# Patient Record
Sex: Male | Born: 1950 | Hispanic: No | Marital: Married | Smoking: Never smoker
Health system: Southern US, Community
[De-identification: ages and names within clinical notes are randomized; demographics above are authoritative.]

## PROBLEM LIST (undated history)

## (undated) DIAGNOSIS — R519 Headache, unspecified: Secondary | ICD-10-CM

## (undated) DIAGNOSIS — F419 Anxiety disorder, unspecified: Secondary | ICD-10-CM

## (undated) DIAGNOSIS — M545 Low back pain, unspecified: Secondary | ICD-10-CM

## (undated) DIAGNOSIS — I499 Cardiac arrhythmia, unspecified: Secondary | ICD-10-CM

## (undated) DIAGNOSIS — G4730 Sleep apnea, unspecified: Secondary | ICD-10-CM

## (undated) DIAGNOSIS — R7303 Prediabetes: Secondary | ICD-10-CM

## (undated) DIAGNOSIS — Z87442 Personal history of urinary calculi: Secondary | ICD-10-CM

## (undated) DIAGNOSIS — H9190 Unspecified hearing loss, unspecified ear: Secondary | ICD-10-CM

## (undated) DIAGNOSIS — E785 Hyperlipidemia, unspecified: Secondary | ICD-10-CM

## (undated) DIAGNOSIS — R918 Other nonspecific abnormal finding of lung field: Secondary | ICD-10-CM

## (undated) HISTORY — PX: APPENDECTOMY: SHX54

## (undated) HISTORY — PX: OTHER SURGICAL HISTORY: SHX169

---
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
## 1976-07-22 DIAGNOSIS — I4510 Unspecified right bundle-branch block: Secondary | ICD-10-CM

## 1976-07-22 HISTORY — DX: Unspecified right bundle-branch block: I45.10

## 1984-07-22 HISTORY — PX: COLONOSCOPY: SHX174

## 1993-07-22 HISTORY — PX: ARTHROSCOPIC REPAIR ACL: SUR80

## 2016-05-17 ENCOUNTER — Ambulatory Visit: Admit: 2016-05-17 | Payer: Self-pay | Admitting: Unknown Physician Specialty

## 2016-05-17 SURGERY — COLONOSCOPY WITH PROPOFOL
Anesthesia: General

## 2016-07-19 ENCOUNTER — Ambulatory Visit: Admission: RE | Admit: 2016-07-19 | Payer: Self-pay | Source: Ambulatory Visit | Admitting: Unknown Physician Specialty

## 2016-07-19 ENCOUNTER — Encounter: Admission: RE | Payer: Self-pay | Source: Ambulatory Visit

## 2016-07-19 SURGERY — COLONOSCOPY WITH PROPOFOL
Anesthesia: General

## 2016-10-31 HISTORY — PX: COLONOSCOPY: SHX174

## 2018-04-15 ENCOUNTER — Other Ambulatory Visit: Payer: Self-pay | Admitting: Family Medicine

## 2018-04-15 ENCOUNTER — Ambulatory Visit
Admission: RE | Admit: 2018-04-15 | Discharge: 2018-04-15 | Disposition: A | Discharge status: Home / Self Care | Payer: Federal, State, Local not specified - PPO | Source: Ambulatory Visit | Attending: Family Medicine | Admitting: Family Medicine

## 2018-04-15 DIAGNOSIS — K5732 Diverticulitis of large intestine without perforation or abscess without bleeding: Secondary | ICD-10-CM | POA: Diagnosis not present

## 2018-04-15 DIAGNOSIS — R1032 Left lower quadrant pain: Secondary | ICD-10-CM

## 2018-04-15 MED ORDER — IOPAMIDOL (ISOVUE-300) INJECTION 61%
100.0000 mL | Freq: Once | INTRAVENOUS | Status: AC | PRN
  Administered 2018-04-15: 100 mL via INTRAVENOUS
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
## 2021-03-06 ENCOUNTER — Other Ambulatory Visit: Payer: Self-pay | Admitting: Student

## 2021-03-06 DIAGNOSIS — M7521 Bicipital tendinitis, right shoulder: Secondary | ICD-10-CM

## 2021-03-06 DIAGNOSIS — M7581 Other shoulder lesions, right shoulder: Secondary | ICD-10-CM

## 2021-03-10 ENCOUNTER — Ambulatory Visit
Admission: RE | Admit: 2021-03-10 | Discharge: 2021-03-10 | Disposition: A | Discharge status: Home / Self Care | Payer: Medicare Other | Source: Ambulatory Visit | Attending: Student | Admitting: Student

## 2021-03-10 DIAGNOSIS — M7581 Other shoulder lesions, right shoulder: Secondary | ICD-10-CM

## 2021-03-10 DIAGNOSIS — M7521 Bicipital tendinitis, right shoulder: Secondary | ICD-10-CM

## 2021-03-11 ENCOUNTER — Ambulatory Visit: Payer: Federal, State, Local not specified - PPO

## 2021-03-12 ENCOUNTER — Other Ambulatory Visit: Payer: Self-pay | Admitting: Student

## 2021-03-12 DIAGNOSIS — M7581 Other shoulder lesions, right shoulder: Secondary | ICD-10-CM

## 2021-03-12 DIAGNOSIS — M7521 Bicipital tendinitis, right shoulder: Secondary | ICD-10-CM

## 2021-04-03 ENCOUNTER — Other Ambulatory Visit (HOSPITAL_COMMUNITY): Payer: Self-pay | Admitting: Student

## 2021-04-03 DIAGNOSIS — M7581 Other shoulder lesions, right shoulder: Secondary | ICD-10-CM

## 2021-04-03 DIAGNOSIS — M7521 Bicipital tendinitis, right shoulder: Secondary | ICD-10-CM

## 2021-04-17 NOTE — Progress Notes (Addendum)
I checked Gabriel Crawford chart for H/P, the last one I found was on 03/06/21 at [facility].  I called the office, [phone], I spoke to [place], she took the information and is forwarding to [place], [place].

## 2021-04-18 ENCOUNTER — Encounter (HOSPITAL_COMMUNITY): Payer: Self-pay | Admitting: *Deleted

## 2021-04-18 NOTE — Progress Notes (Signed)
I spoke with [place] at [facility] at [phone] requesting H&P from [place], [place] for surgery (04/19/21) on this patient. Patient is being seen this morning and PA will put in H&P after the appointment.

## 2021-04-18 NOTE — Anesthesia Preprocedure Evaluation (Addendum)
Anesthesia Evaluation  Patient identified by MRN, date of birth, ID band Patient awake    Reviewed: Allergy & Precautions, NPO status , Patient's Chart, lab work & pertinent test results  Airway Mallampati: III  TM Distance: >3 FB Neck ROM: Full    Dental  (+) Teeth Intact   Pulmonary sleep apnea and Continuous Positive Airway Pressure Ventilation ,    Pulmonary exam normal        Cardiovascular negative cardio ROS   Rhythm:Regular Rate:Normal     Neuro/Psych  Headaches, Anxiety    GI/Hepatic negative GI ROS, Neg liver ROS,   Endo/Other  negative endocrine ROS  Renal/GU negative Renal ROS  negative genitourinary   Musculoskeletal  (+) Arthritis , Osteoarthritis,    Abdominal (+) + obese,   Peds  Hematology negative hematology ROS (+)   Anesthesia Other Findings   Reproductive/Obstetrics                            Anesthesia Physical Anesthesia Plan  ASA: 3  Anesthesia Plan: MAC   Post-op Pain Management:    Induction: Intravenous  PONV Risk Score and Plan: 2 and Ondansetron, Dexamethasone, Midazolam and Treatment may vary due to age or medical condition  Airway Management Planned: Simple Face Mask  Additional Equipment: None  Intra-op Plan:   Post-operative Plan:   Informed Consent: I have reviewed the patients History and Physical, chart, labs and discussed the procedure including the risks, benefits and alternatives for the proposed anesthesia with the patient or authorized representative who has indicated his/her understanding and acceptance.     Dental advisory given  Plan Discussed with: CRNA  Anesthesia Plan Comments: (Plan for mac with advancement to GA if needed.)       Anesthesia Quick Evaluation

## 2021-04-18 NOTE — Progress Notes (Signed)
DUE TO COVID-19 ONLY ONE VISITOR IS ALLOWED TO COME WITH YOU AND STAY IN THE WAITING ROOM ONLY DURING PRE OP AND PROCEDURE DAY OF SURGERY.   PCP - Dr Kandyce Rud Cardiologist - n/a  Chest x-ray - n/a EKG - n/a Stress Test - n/a ECHO - n/a Cardiac Cath - n/a  ICD Pacemaker/Loop - n/a  Sleep Study -  Yes CPAP - uses cpap  Anesthesia review: Yes  STOP now taking any Aspirin (unless otherwise instructed by your surgeon), Aleve, Naproxen, Ibuprofen, Motrin, Advil, Goody's, BC's, all herbal medications, fish oil, and all vitamins.   Coronavirus Screening Covid test is n/a Ambulatory Surgery  Do you have any of the following symptoms:  Cough yes/no: No Fever (>100.24F)  yes/no: No Runny nose yes/no: No Sore throat yes/no: No Difficulty breathing/shortness of breath  yes/no: No  Have you traveled in the last 14 days and where? yes/no: No  Patient verbalized understanding of instructions that were given via phone.

## 2021-04-19 ENCOUNTER — Encounter (HOSPITAL_COMMUNITY): Payer: Self-pay

## 2021-04-19 ENCOUNTER — Ambulatory Visit (HOSPITAL_COMMUNITY): Admission: RE | Admit: 2021-04-19 | Payer: Medicare Other | Source: Home / Self Care

## 2021-04-19 ENCOUNTER — Other Ambulatory Visit: Payer: Self-pay

## 2021-04-19 ENCOUNTER — Encounter (HOSPITAL_COMMUNITY): Admission: RE | Disposition: A | Discharge status: Home / Self Care | Payer: Self-pay | Source: Home / Self Care

## 2021-04-19 ENCOUNTER — Ambulatory Visit (HOSPITAL_COMMUNITY): Payer: Medicare Other | Admitting: Anesthesiology

## 2021-04-19 ENCOUNTER — Ambulatory Visit (HOSPITAL_COMMUNITY)
Admission: RE | Admit: 2021-04-19 | Discharge: 2021-04-19 | Disposition: A | Discharge status: Home / Self Care | Payer: Medicare Other | Attending: Student | Admitting: Student

## 2021-04-19 ENCOUNTER — Ambulatory Visit (HOSPITAL_COMMUNITY)
Admission: RE | Admit: 2021-04-19 | Discharge: 2021-04-19 | Disposition: A | Discharge status: Home / Self Care | Payer: Medicare Other | Source: Ambulatory Visit | Attending: Student | Admitting: Student

## 2021-04-19 ENCOUNTER — Encounter (HOSPITAL_COMMUNITY): Admission: RE | Payer: Self-pay | Source: Home / Self Care

## 2021-04-19 DIAGNOSIS — Y929 Unspecified place or not applicable: Secondary | ICD-10-CM | POA: Insufficient documentation

## 2021-04-19 DIAGNOSIS — M7521 Bicipital tendinitis, right shoulder: Secondary | ICD-10-CM | POA: Insufficient documentation

## 2021-04-19 DIAGNOSIS — M7581 Other shoulder lesions, right shoulder: Secondary | ICD-10-CM | POA: Insufficient documentation

## 2021-04-19 DIAGNOSIS — Y939 Activity, unspecified: Secondary | ICD-10-CM | POA: Insufficient documentation

## 2021-04-19 HISTORY — DX: Sleep apnea, unspecified: G47.30

## 2021-04-19 HISTORY — DX: Other nonspecific abnormal finding of lung field: R91.8

## 2021-04-19 HISTORY — DX: Anxiety disorder, unspecified: F41.9

## 2021-04-19 HISTORY — DX: Low back pain, unspecified: M54.50

## 2021-04-19 HISTORY — DX: Unspecified hearing loss, unspecified ear: H91.90

## 2021-04-19 HISTORY — DX: Personal history of urinary calculi: Z87.442

## 2021-04-19 HISTORY — DX: Headache, unspecified: R51.9

## 2021-04-19 HISTORY — DX: Hyperlipidemia, unspecified: E78.5

## 2021-04-19 HISTORY — PX: RADIOLOGY WITH ANESTHESIA: SHX6223

## 2021-04-19 HISTORY — DX: Prediabetes: R73.03

## 2021-04-19 HISTORY — DX: Cardiac arrhythmia, unspecified: I49.9

## 2021-04-19 SURGERY — MRI WITH ANESTHESIA
Anesthesia: Monitor Anesthesia Care | Laterality: Right

## 2021-04-19 SURGERY — MRI WITH ANESTHESIA
Anesthesia: General | Laterality: Right

## 2021-04-19 MED ORDER — ORAL CARE MOUTH RINSE: 15.0000 mL | Freq: Once | OROMUCOSAL | Status: DC

## 2021-04-19 MED ORDER — KETOROLAC TROMETHAMINE 30 MG/ML IJ SOLN: 30.0000 mg | Freq: Once | INTRAMUSCULAR | Status: DC | PRN

## 2021-04-19 MED ORDER — DEXMEDETOMIDINE (PRECEDEX) IN NS 20 MCG/5ML (4 MCG/ML) IV SYRINGE
PREFILLED_SYRINGE | INTRAVENOUS | Status: DC | PRN
  Administered 2021-04-19: 8 ug via INTRAVENOUS

## 2021-04-19 MED ORDER — PROMETHAZINE HCL 25 MG/ML IJ SOLN: 6.2500 mg | INTRAMUSCULAR | Status: DC | PRN

## 2021-04-19 MED ORDER — LACTATED RINGERS IV SOLN: INTRAVENOUS | Status: DC

## 2021-04-19 MED ORDER — MIDAZOLAM HCL 5 MG/5ML IJ SOLN
INTRAMUSCULAR | Status: DC | PRN
  Administered 2021-04-19 (×2): 1 mg via INTRAVENOUS

## 2021-04-19 MED ORDER — CHLORHEXIDINE GLUCONATE 0.12 % MT SOLN: 15.0000 mL | Freq: Once | OROMUCOSAL | Status: DC

## 2021-04-19 NOTE — Transfer of Care (Signed)
Immediate Anesthesia Transfer of Care Note  Patient: EVERT WENRICH  Procedure(s) Performed: MRI WITH ANESTHESIA - RIGHT SHOULDER WITHOUT CONTRAST (Right)  Patient Location: PACU  Anesthesia Type:MAC  Level of Consciousness: awake, alert  and oriented  Airway & Oxygen Therapy: Patient Spontanous Breathing  Post-op Assessment: Report given to RN and Post -op Vital signs reviewed and stable  Post vital signs: Reviewed and stable  Last Vitals:  Vitals Value Taken Time  BP    Temp    Pulse    Resp    SpO2      Last Pain:  Vitals:   04/19/21 0945  TempSrc:   PainSc: 0-No pain      Patients Stated Pain Goal: 2 (83/25/49 8264)  Complications: No notable events documented.

## 2021-04-19 NOTE — Anesthesia Postprocedure Evaluation (Signed)
Anesthesia Post Note  Patient: Gabriel Crawford  Procedure(s) Performed: MRI WITH ANESTHESIA - RIGHT SHOULDER WITHOUT CONTRAST (Right)     Patient location during evaluation: PACU Anesthesia Type: MAC Level of consciousness: awake and alert Pain management: pain level controlled Vital Signs Assessment: post-procedure vital signs reviewed and stable Respiratory status: spontaneous breathing, nonlabored ventilation, respiratory function stable and patient connected to nasal cannula oxygen Cardiovascular status: stable and blood pressure returned to baseline Postop Assessment: no apparent nausea or vomiting Anesthetic complications: no   No notable events documented.  Last Vitals:  Vitals:   04/19/21 0930 04/19/21 0945  BP: (!) 143/89 139/87  Pulse: 75 82  Resp: 17 16  Temp: (!) 36.3 C (!) 36.3 C  SpO2: 98% 98%    Last Pain:  Vitals:   04/19/21 0945  TempSrc:   PainSc: 0-No pain                 Belenda Cruise P Ashlei Chinchilla

## 2021-04-20 ENCOUNTER — Encounter (HOSPITAL_COMMUNITY): Payer: Self-pay | Admitting: Radiology

## 2021-05-25 ENCOUNTER — Other Ambulatory Visit: Payer: Self-pay | Admitting: Surgery

## 2021-06-05 ENCOUNTER — Other Ambulatory Visit
Admission: RE | Admit: 2021-06-05 | Discharge: 2021-06-05 | Disposition: A | Discharge status: Home / Self Care | Payer: Medicare Other | Source: Ambulatory Visit | Attending: Surgery | Admitting: Surgery

## 2021-06-05 ENCOUNTER — Other Ambulatory Visit: Payer: Self-pay

## 2021-06-05 NOTE — Patient Instructions (Signed)
Your procedure is scheduled on: Tuesday June 12, 2021. Report to Day Surgery inside [facility] 2nd floor. To find out your arrival time please call [phone] between 1PM - 3PM on Monday June 11, 2021.  Remember: Instructions that are not followed completely may result in serious medical risk,  up to and including death, or upon the discretion of your surgeon and anesthesiologist your  surgery may need to be rescheduled.     _X__ 1. Do not eat food after midnight the night before your procedure.                 No chewing gum or hard candies. You may drink clear liquids up to 2 hours                 before you are scheduled to arrive for your surgery- DO not drink clear                 liquids within 2 hours of the start of your surgery.                 Clear Liquids include:  water,   __X__2.  On the morning of surgery brush your teeth with toothpaste and water, you                may rinse your mouth with mouthwash if you wish.  Do not swallow any toothpaste or mouthwash.     _X__ 3.  No Alcohol for 24 hours before or after surgery.   _X__ 4.  Do Not Smoke or use e-cigarettes For 24 Hours Prior to Your Surgery.                 Do not use any chewable tobacco products for at least 6 hours prior to                 Surgery.  _X__  5.  Do not use any recreational drugs (marijuana, cocaine, heroin, ecstasy, MDMA or other)                For at least one week prior to your surgery.  Combination of these drugs with anesthesia                May have life threatening results.  __X__6.  Notify your doctor if there is any change in your medical condition      (cold, fever, infections).     Do not wear jewelry, make-up, hairpins, clips or nail polish. Do not wear lotions, powders, perfumes or deodorant. Do not shave 48 hours prior to surgery. Men may shave face and neck. Do not bring valuables to the hospital.    [facility] is not responsible for any  belongings or valuables.  Contacts, dentures or bridgework may not be worn into surgery. Leave your suitcase in the car. After surgery it may be brought to your room. For patients admitted to the hospital, discharge time is determined by your treatment team.   Patients discharged the day of surgery will not be allowed to drive home.   Make arrangements for someone to be with you for the first 24 hours of your Same Day Discharge.   _x___ Take these medicines the morning of surgery with A SIP OF WATER:    1. None  2.   3.   4.  5.  6.  ____ Fleet Enema (as directed)   __X__ Use CHG Soap (or wipes) as directed  ____ Use Benzoyl Peroxide Gel as instructed  ____ Use inhalers on the day of surgery  ____ Stop metformin 2 days prior to surgery    ____ Take 1/2 of usual insulin dose the night before surgery. No insulin the morning          of surgery.   ____ Call your PCP, cardiologist, or Pulmonologist if taking Coumadin/Plavix/aspirin and ask when to stop before your surgery.   __X__ One Week prior to surgery- Stop Anti-inflammatories such as Ibuprofen, Aleve, Advil, Motrin, meloxicam (MOBIC), diclofenac, etodolac, ketorolac, Toradol, Daypro, piroxicam, Goody's or BC powders. OK TO USE TYLENOL IF NEEDED   __X__ Do not supplements until after surgery.    __X__ Bring C-Pap to the hospital.    If you have any questions regarding your pre-procedure instructions,  Please call Pre-admit Testing at [phone]

## 2021-06-06 ENCOUNTER — Encounter
Admission: RE | Admit: 2021-06-06 | Discharge: 2021-06-06 | Disposition: A | Discharge status: Home / Self Care | Payer: Medicare Other | Source: Ambulatory Visit | Attending: Surgery | Admitting: Surgery

## 2021-06-06 DIAGNOSIS — Z01810 Encounter for preprocedural cardiovascular examination: Secondary | ICD-10-CM | POA: Diagnosis present

## 2021-06-11 MED ORDER — CHLORHEXIDINE GLUCONATE 0.12 % MT SOLN: 15.0000 mL | Freq: Once | OROMUCOSAL | Status: AC

## 2021-06-11 MED ORDER — FAMOTIDINE 20 MG PO TABS: 20.0000 mg | ORAL_TABLET | Freq: Once | ORAL | Status: AC

## 2021-06-11 MED ORDER — LACTATED RINGERS IV SOLN: INTRAVENOUS | Status: DC

## 2021-06-11 MED ORDER — CEFAZOLIN SODIUM-DEXTROSE 2-4 GM/100ML-% IV SOLN
2.0000 g | INTRAVENOUS | Status: AC
  Administered 2021-06-12: 2 g via INTRAVENOUS

## 2021-06-11 MED ORDER — ORAL CARE MOUTH RINSE: 15.0000 mL | Freq: Once | OROMUCOSAL | Status: AC

## 2021-06-12 ENCOUNTER — Ambulatory Visit: Payer: No Typology Code available for payment source

## 2021-06-12 ENCOUNTER — Encounter: Payer: Self-pay | Admitting: Surgery

## 2021-06-12 ENCOUNTER — Ambulatory Visit: Payer: No Typology Code available for payment source | Admitting: Registered Nurse

## 2021-06-12 ENCOUNTER — Ambulatory Visit
Admission: RE | Admit: 2021-06-12 | Discharge: 2021-06-12 | Disposition: A | Discharge status: Home / Self Care | Payer: No Typology Code available for payment source | Attending: Surgery | Admitting: Surgery

## 2021-06-12 ENCOUNTER — Other Ambulatory Visit: Payer: Self-pay

## 2021-06-12 ENCOUNTER — Encounter
Admission: RE | Disposition: A | Discharge status: Home / Self Care | Payer: Self-pay | Source: Home / Self Care | Attending: Surgery

## 2021-06-12 DIAGNOSIS — M75101 Unspecified rotator cuff tear or rupture of right shoulder, not specified as traumatic: Secondary | ICD-10-CM | POA: Diagnosis present

## 2021-06-12 DIAGNOSIS — R519 Headache, unspecified: Secondary | ICD-10-CM | POA: Diagnosis not present

## 2021-06-12 DIAGNOSIS — E119 Type 2 diabetes mellitus without complications: Secondary | ICD-10-CM | POA: Diagnosis not present

## 2021-06-12 DIAGNOSIS — M19011 Primary osteoarthritis, right shoulder: Secondary | ICD-10-CM | POA: Diagnosis not present

## 2021-06-12 DIAGNOSIS — M25811 Other specified joint disorders, right shoulder: Secondary | ICD-10-CM | POA: Insufficient documentation

## 2021-06-12 DIAGNOSIS — G4730 Sleep apnea, unspecified: Secondary | ICD-10-CM | POA: Insufficient documentation

## 2021-06-12 DIAGNOSIS — M7521 Bicipital tendinitis, right shoulder: Secondary | ICD-10-CM | POA: Diagnosis not present

## 2021-06-12 DIAGNOSIS — S46011A Strain of muscle(s) and tendon(s) of the rotator cuff of right shoulder, initial encounter: Secondary | ICD-10-CM | POA: Insufficient documentation

## 2021-06-12 DIAGNOSIS — F419 Anxiety disorder, unspecified: Secondary | ICD-10-CM | POA: Insufficient documentation

## 2021-06-12 DIAGNOSIS — Z419 Encounter for procedure for purposes other than remedying health state, unspecified: Secondary | ICD-10-CM

## 2021-06-12 DIAGNOSIS — Z7984 Long term (current) use of oral hypoglycemic drugs: Secondary | ICD-10-CM | POA: Insufficient documentation

## 2021-06-12 HISTORY — PX: SHOULDER ARTHROSCOPY WITH SUBACROMIAL DECOMPRESSION, ROTATOR CUFF REPAIR AND BICEP TENDON REPAIR: SHX5687

## 2021-06-12 LAB — GLUCOSE, CAPILLARY: Glucose-Capillary: 124 mg/dL — ABNORMAL HIGH (ref 70–99)

## 2021-06-12 SURGERY — SHOULDER ARTHROSCOPY WITH SUBACROMIAL DECOMPRESSION, ROTATOR CUFF REPAIR AND BICEP TENDON REPAIR
Anesthesia: General | Site: Shoulder | Laterality: Right

## 2021-06-12 MED ORDER — LACTATED RINGERS IR SOLN
Status: DC | PRN
  Administered 2021-06-12: 1000 mL

## 2021-06-12 MED ORDER — HYDROCODONE-ACETAMINOPHEN 5-325 MG PO TABS
1.0000 | ORAL_TABLET | Freq: Four times a day (QID) | ORAL | 0 refills | Status: DC | PRN
Start: 2021-06-12 — End: 2021-10-30

## 2021-06-12 MED ORDER — ONDANSETRON HCL 4 MG/2ML IJ SOLN: 4.0000 mg | Freq: Once | INTRAMUSCULAR | Status: DC | PRN

## 2021-06-12 MED ORDER — DEXAMETHASONE SODIUM PHOSPHATE 10 MG/ML IJ SOLN
INTRAMUSCULAR | Status: AC
  Filled 2021-06-12: qty 1

## 2021-06-12 MED ORDER — MIDAZOLAM HCL 2 MG/2ML IJ SOLN
INTRAMUSCULAR | Status: AC
  Administered 2021-06-12: 2 mg via INTRAVENOUS
  Filled 2021-06-12: qty 2

## 2021-06-12 MED ORDER — FENTANYL CITRATE PF 50 MCG/ML IJ SOSY
PREFILLED_SYRINGE | INTRAMUSCULAR | Status: AC
  Administered 2021-06-12: 100 ug via INTRAVENOUS
  Filled 2021-06-12: qty 2

## 2021-06-12 MED ORDER — EPINEPHRINE PF 1 MG/ML IJ SOLN
INTRAMUSCULAR | Status: AC
  Filled 2021-06-12: qty 2

## 2021-06-12 MED ORDER — EPHEDRINE 5 MG/ML INJ
INTRAVENOUS | Status: AC
  Filled 2021-06-12: qty 5

## 2021-06-12 MED ORDER — LIDOCAINE HCL (PF) 2 % IJ SOLN
INTRAMUSCULAR | Status: AC
  Filled 2021-06-12: qty 5

## 2021-06-12 MED ORDER — LIDOCAINE HCL (CARDIAC) PF 100 MG/5ML IV SOSY
PREFILLED_SYRINGE | INTRAVENOUS | Status: DC | PRN
  Administered 2021-06-12: 60 mg via INTRAVENOUS

## 2021-06-12 MED ORDER — PHENYLEPHRINE HCL (PRESSORS) 10 MG/ML IV SOLN
INTRAVENOUS | Status: DC | PRN
  Administered 2021-06-12: 160 ug via INTRAVENOUS
  Administered 2021-06-12: 80 ug via INTRAVENOUS
  Administered 2021-06-12: 160 ug via INTRAVENOUS

## 2021-06-12 MED ORDER — CEFAZOLIN SODIUM-DEXTROSE 2-4 GM/100ML-% IV SOLN
INTRAVENOUS | Status: AC
  Filled 2021-06-12: qty 100

## 2021-06-12 MED ORDER — BUPIVACAINE HCL (PF) 0.5 % IJ SOLN
INTRAMUSCULAR | Status: AC
  Filled 2021-06-12: qty 10

## 2021-06-12 MED ORDER — CHLORHEXIDINE GLUCONATE 0.12 % MT SOLN
OROMUCOSAL | Status: AC
  Administered 2021-06-12: 15 mL via OROMUCOSAL
  Filled 2021-06-12: qty 15

## 2021-06-12 MED ORDER — SUGAMMADEX SODIUM 500 MG/5ML IV SOLN
INTRAVENOUS | Status: DC | PRN
  Administered 2021-06-12: 225 mg via INTRAVENOUS

## 2021-06-12 MED ORDER — PROPOFOL 10 MG/ML IV BOLUS
INTRAVENOUS | Status: AC
  Filled 2021-06-12: qty 40

## 2021-06-12 MED ORDER — ROCURONIUM BROMIDE 100 MG/10ML IV SOLN
INTRAVENOUS | Status: DC | PRN
  Administered 2021-06-12 (×2): 10 mg via INTRAVENOUS
  Administered 2021-06-12: 20 mg via INTRAVENOUS
  Administered 2021-06-12: 10 mg via INTRAVENOUS
  Administered 2021-06-12: 50 mg via INTRAVENOUS

## 2021-06-12 MED ORDER — ACETAMINOPHEN 10 MG/ML IV SOLN
INTRAVENOUS | Status: DC | PRN
  Administered 2021-06-12: 1000 mg via INTRAVENOUS

## 2021-06-12 MED ORDER — EPHEDRINE SULFATE 50 MG/ML IJ SOLN
INTRAMUSCULAR | Status: DC | PRN
  Administered 2021-06-12 (×2): 10 mg via INTRAVENOUS
  Administered 2021-06-12: 5 mg via INTRAVENOUS

## 2021-06-12 MED ORDER — KETAMINE HCL 10 MG/ML IJ SOLN
INTRAMUSCULAR | Status: DC | PRN
  Administered 2021-06-12: 40 mg via INTRAVENOUS
  Administered 2021-06-12: 10 mg via INTRAVENOUS

## 2021-06-12 MED ORDER — DEXMEDETOMIDINE (PRECEDEX) IN NS 20 MCG/5ML (4 MCG/ML) IV SYRINGE
PREFILLED_SYRINGE | INTRAVENOUS | Status: AC
  Filled 2021-06-12: qty 5

## 2021-06-12 MED ORDER — BUPIVACAINE-EPINEPHRINE 0.5% -1:200000 IJ SOLN
INTRAMUSCULAR | Status: DC | PRN
  Administered 2021-06-12: 30 mL

## 2021-06-12 MED ORDER — PROPOFOL 10 MG/ML IV BOLUS
INTRAVENOUS | Status: DC | PRN
  Administered 2021-06-12: 180 mg via INTRAVENOUS
  Administered 2021-06-12: 20 mg via INTRAVENOUS

## 2021-06-12 MED ORDER — KETOROLAC TROMETHAMINE 30 MG/ML IJ SOLN
INTRAMUSCULAR | Status: DC | PRN
  Administered 2021-06-12: 30 mg via INTRAVENOUS

## 2021-06-12 MED ORDER — MIDAZOLAM HCL 2 MG/2ML IJ SOLN: 2.0000 mg | Freq: Once | INTRAMUSCULAR | Status: AC

## 2021-06-12 MED ORDER — FENTANYL CITRATE (PF) 100 MCG/2ML IJ SOLN: 25.0000 ug | INTRAMUSCULAR | Status: DC | PRN

## 2021-06-12 MED ORDER — SODIUM CHLORIDE 0.9 % IV SOLN: INTRAVENOUS | Status: DC

## 2021-06-12 MED ORDER — DEXAMETHASONE SODIUM PHOSPHATE 10 MG/ML IJ SOLN
INTRAMUSCULAR | Status: DC | PRN
  Administered 2021-06-12: 10 mg via INTRAVENOUS

## 2021-06-12 MED ORDER — ONDANSETRON HCL 4 MG/2ML IJ SOLN: 4.0000 mg | Freq: Four times a day (QID) | INTRAMUSCULAR | Status: DC | PRN

## 2021-06-12 MED ORDER — BUPIVACAINE HCL (PF) 0.5 % IJ SOLN
INTRAMUSCULAR | Status: DC | PRN
  Administered 2021-06-12: 10 mL via PERINEURAL

## 2021-06-12 MED ORDER — BUPIVACAINE LIPOSOME 1.3 % IJ SUSP
INTRAMUSCULAR | Status: DC | PRN
  Administered 2021-06-12: 20 mL via PERINEURAL

## 2021-06-12 MED ORDER — ONDANSETRON HCL 4 MG/2ML IJ SOLN
INTRAMUSCULAR | Status: DC | PRN
  Administered 2021-06-12: 4 mg via INTRAVENOUS

## 2021-06-12 MED ORDER — FENTANYL CITRATE PF 50 MCG/ML IJ SOSY: 100.0000 ug | PREFILLED_SYRINGE | Freq: Once | INTRAMUSCULAR | Status: AC

## 2021-06-12 MED ORDER — METOCLOPRAMIDE HCL 10 MG PO TABS: 5.0000 mg | ORAL_TABLET | Freq: Three times a day (TID) | ORAL | Status: DC | PRN

## 2021-06-12 MED ORDER — HYDROCODONE-ACETAMINOPHEN 5-325 MG PO TABS: 1.0000 | ORAL_TABLET | ORAL | Status: DC | PRN

## 2021-06-12 MED ORDER — ONDANSETRON HCL 4 MG/2ML IJ SOLN
INTRAMUSCULAR | Status: AC
  Filled 2021-06-12: qty 2

## 2021-06-12 MED ORDER — ONDANSETRON HCL 4 MG PO TABS: 4.0000 mg | ORAL_TABLET | Freq: Four times a day (QID) | ORAL | Status: DC | PRN

## 2021-06-12 MED ORDER — BUPIVACAINE LIPOSOME 1.3 % IJ SUSP
INTRAMUSCULAR | Status: AC
  Filled 2021-06-12: qty 20

## 2021-06-12 MED ORDER — BUPIVACAINE-EPINEPHRINE (PF) 0.5% -1:200000 IJ SOLN
INTRAMUSCULAR | Status: AC
  Filled 2021-06-12: qty 30

## 2021-06-12 MED ORDER — KETAMINE HCL 50 MG/5ML IJ SOSY
PREFILLED_SYRINGE | INTRAMUSCULAR | Status: AC
  Filled 2021-06-12: qty 5

## 2021-06-12 MED ORDER — KETOROLAC TROMETHAMINE 30 MG/ML IJ SOLN
INTRAMUSCULAR | Status: AC
  Filled 2021-06-12: qty 1

## 2021-06-12 MED ORDER — DEXMEDETOMIDINE (PRECEDEX) IN NS 20 MCG/5ML (4 MCG/ML) IV SYRINGE
PREFILLED_SYRINGE | INTRAVENOUS | Status: DC | PRN
  Administered 2021-06-12 (×2): 10 ug via INTRAVENOUS

## 2021-06-12 MED ORDER — LACTATED RINGERS IR SOLN
Status: DC | PRN
  Administered 2021-06-12: 6000 mL

## 2021-06-12 MED ORDER — MEPERIDINE HCL 25 MG/ML IJ SOLN: 6.2500 mg | INTRAMUSCULAR | Status: DC | PRN

## 2021-06-12 MED ORDER — METOCLOPRAMIDE HCL 5 MG/ML IJ SOLN: 5.0000 mg | Freq: Three times a day (TID) | INTRAMUSCULAR | Status: DC | PRN

## 2021-06-12 MED ORDER — FAMOTIDINE 20 MG PO TABS
ORAL_TABLET | ORAL | Status: AC
  Administered 2021-06-12: 20 mg via ORAL
  Filled 2021-06-12: qty 1

## 2021-06-12 MED ORDER — ROCURONIUM BROMIDE 10 MG/ML (PF) SYRINGE
PREFILLED_SYRINGE | INTRAVENOUS | Status: AC
  Filled 2021-06-12: qty 10

## 2021-06-12 MED ORDER — ACETAMINOPHEN 10 MG/ML IV SOLN
INTRAVENOUS | Status: AC
  Filled 2021-06-12: qty 100

## 2021-06-12 MED ORDER — FENTANYL CITRATE (PF) 100 MCG/2ML IJ SOLN
INTRAMUSCULAR | Status: AC
  Filled 2021-06-12: qty 2

## 2021-06-12 SURGICAL SUPPLY — 55 items
ANCH SUT 2 2.9 2 LD TPR NDL (Anchor) ×1 IMPLANT
ANCH SUT BN ASCP DLV (Anchor) ×1 IMPLANT
ANCH SUT RGNRT REGENETEN (Staple) ×1 IMPLANT
ANCHOR BONE REGENETEN (Anchor) ×2 IMPLANT
ANCHOR JUGGERKNOT WTAP NDL 2.9 (Anchor) ×2 IMPLANT
ANCHOR TENDON REGENETEN (Staple) ×2 IMPLANT
APL PRP STRL LF DISP 70% ISPRP (MISCELLANEOUS) ×1
BIT DRILL JUGRKNT W/NDL BIT2.9 (DRILL) ×1 IMPLANT
BLADE FULL RADIUS 3.5 (BLADE) ×2 IMPLANT
BUR ACROMIONIZER 4.0 (BURR) ×2 IMPLANT
CHLORAPREP W/TINT 26 (MISCELLANEOUS) ×2 IMPLANT
COVER MAYO STAND REUSABLE (DRAPES) ×2 IMPLANT
DILATOR 5.5 THREADED HEALICOIL (MISCELLANEOUS) IMPLANT
DRAPE IMP U-DRAPE 54X76 (DRAPES) ×4 IMPLANT
DRILL JUGGERKNOT W/NDL BIT 2.9 (DRILL) ×2
ELECT CAUTERY BLADE 6.4 (BLADE) ×2 IMPLANT
ELECT REM PT RETURN 9FT ADLT (ELECTROSURGICAL) ×2
ELECTRODE REM PT RTRN 9FT ADLT (ELECTROSURGICAL) ×1 IMPLANT
GAUZE SPONGE 4X4 12PLY STRL (GAUZE/BANDAGES/DRESSINGS) ×2 IMPLANT
GAUZE XEROFORM 1X8 LF (GAUZE/BANDAGES/DRESSINGS) ×2 IMPLANT
GLOVE SRG 8 PF TXTR STRL LF DI (GLOVE) ×1 IMPLANT
GLOVE SURG ENC MOIS LTX SZ7.5 (GLOVE) ×4 IMPLANT
GLOVE SURG ENC MOIS LTX SZ8 (GLOVE) ×4 IMPLANT
GLOVE SURG UNDER LTX SZ8 (GLOVE) ×2 IMPLANT
GLOVE SURG UNDER POLY LF SZ8 (GLOVE) ×2
GOWN STRL REUS W/ TWL LRG LVL3 (GOWN DISPOSABLE) ×1 IMPLANT
GOWN STRL REUS W/ TWL XL LVL3 (GOWN DISPOSABLE) ×1 IMPLANT
GOWN STRL REUS W/TWL LRG LVL3 (GOWN DISPOSABLE) ×2
GOWN STRL REUS W/TWL XL LVL3 (GOWN DISPOSABLE) ×2
GRASPER SUT 15 45D LOW PRO (SUTURE) IMPLANT
IMPL REGENETEN MEDIUM (Shoulder) ×1 IMPLANT
IMPLANT REGENETEN MEDIUM (Shoulder) ×2 IMPLANT
IV LACTATED RINGER IRRG 3000ML (IV SOLUTION) ×4
IV LR IRRIG 3000ML ARTHROMATIC (IV SOLUTION) ×2 IMPLANT
KIT CANNULA 8X76-LX IN CANNULA (CANNULA) IMPLANT
MANIFOLD NEPTUNE II (INSTRUMENTS) ×4 IMPLANT
MASK FACE SPIDER DISP (MASK) ×2 IMPLANT
MAT ABSORB  FLUID 56X50 GRAY (MISCELLANEOUS) ×1
MAT ABSORB FLUID 56X50 GRAY (MISCELLANEOUS) ×1 IMPLANT
PACK ARTHROSCOPY SHOULDER (MISCELLANEOUS) ×2 IMPLANT
PASSER SUT FIRSTPASS SELF (INSTRUMENTS) IMPLANT
SLING ARM LRG DEEP (SOFTGOODS) ×2 IMPLANT
SLING ULTRA II LG (MISCELLANEOUS) ×2 IMPLANT
SPONGE T-LAP 18X18 ~~LOC~~+RFID (SPONGE) ×2 IMPLANT
STAPLER SKIN PROX 35W (STAPLE) ×2 IMPLANT
STRAP SAFETY 5IN WIDE (MISCELLANEOUS) ×2 IMPLANT
SUT ETHIBOND 0 MO6 C/R (SUTURE) ×2 IMPLANT
SUT ULTRABRAID 2 COBRAID 38 (SUTURE) IMPLANT
SUT VIC AB 2-0 CT1 27 (SUTURE) ×4
SUT VIC AB 2-0 CT1 TAPERPNT 27 (SUTURE) ×2 IMPLANT
TAPE MICROFOAM 4IN (TAPE) ×2 IMPLANT
TUBING CONNECTING 10 (TUBING) ×2 IMPLANT
TUBING INFLOW SET DBFLO PUMP (TUBING) ×2 IMPLANT
WAND WEREWOLF FLOW 90D (MISCELLANEOUS) ×2 IMPLANT
WATER STERILE IRR 500ML POUR (IV SOLUTION) ×2 IMPLANT

## 2021-06-12 NOTE — Transfer of Care (Signed)
Immediate Anesthesia Transfer of Care Note  Patient: Gabriel Crawford  Procedure(s) Performed: Shoulder arthroscopy with debridement, decompression, distal clavicle excision, probable biceps tenodesis and probable rotator cuff repair (Right: Shoulder)  Patient Location: PACU  Anesthesia Type:General and Regional  Level of Consciousness: awake, alert  and oriented  Airway & Oxygen Therapy: Patient Spontanous Breathing and Patient connected to face mask oxygen  Post-op Assessment: Report given to RN and Post -op Vital signs reviewed and stable  Post vital signs: Reviewed and stable  Last Vitals:  Vitals Value Taken Time  BP 140/85 06/12/21 1545  Temp 36.8 C 06/12/21 1545  Pulse 82 06/12/21 1553  Resp 17 06/12/21 1553  SpO2 99 % 06/12/21 1553    Last Pain:  Vitals:   06/12/21 1545  TempSrc:   PainSc: 0-No pain         Complications: No notable events documented.
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
## 2021-06-12 NOTE — Discharge Instructions (Addendum)
Orthopedic discharge instructions: Keep dressing dry and intact.  May shower after dressing changed on post-op day #4 (Saturday).  Cover staples/sutures with Band-Aids after drying off. Apply ice frequently to shoulder. Take ibuprofen 600-800 mg TID with meals for 7-10 days, then as necessary. Take hydrocodone as prescribed or ES Tylenol if needed. Keep shoulder immobilizer on at all times except may remove for bathing purposes. Follow-up in 10-14 days or as scheduled.  AMBULATORY SURGERY  DISCHARGE INSTRUCTIONS   The drugs that you were given will stay in your system until tomorrow so for the next 24 hours you should not:  Drive an automobile Make any legal decisions Drink any alcoholic beverage   You may resume regular meals tomorrow.  Today it is better to start with liquids and gradually work up to solid foods.  You may eat anything you prefer, but it is better to start with liquids, then soup and crackers, and gradually work up to solid foods.   Please notify your doctor immediately if you have any unusual bleeding, trouble breathing, redness and pain at the surgery site, drainage, fever, or pain not relieved by medication.    Additional Instructions: Keep green band on for 4 days  Please contact your physician with any problems or Same Day Surgery at [phone], Monday through Friday 6 am to 4 pm, or [place] [facility] number at [phone].      Tylenol 500mg  every 8 hours starting at 11:00.  Then ibuprofen 800mg  3:00AM  alternate every 4 hours   Continue to alternate one or the other every 4 hours    Interscalene Nerve Block with Exparel   For your surgery you have received an Interscalene Nerve Block with Exparel. Nerve Blocks affect many types of nerves, including nerves that control movement, pain and normal sensation.  You may experience feelings such as numbness, tingling, heaviness, weakness or the inability to move your arm or the feeling or  sensation that your arm has "fallen asleep". A nerve block with Exparel can last up to 5 days.  Usually the weakness wears off first.  The tingling and heaviness usually wear off next.  Finally you may start to notice pain.  Keep in mind that this may occur in any order.  Once a nerve block starts to wear off it is usually completely gone within 60 minutes. ISNB may cause mild shortness of breath, a hoarse voice, blurry vision, unequal pupils, or drooping of the face on the same side as the nerve block.  These symptoms will usually resolve with the numbness.  Very rarely the procedure itself can cause mild seizures. If needed, your surgeon will give you a prescription for pain medication.  It will take about 60 minutes for the oral pain medication to become fully effective.  So, it is recommended that you start taking this medication before the nerve block first begins to wear off, or when you first begin to feel discomfort. Take your pain medication only as prescribed.  Pain medication can cause sedation and decrease your breathing if you take more than you need for the level of pain that you have. Nausea is a common side effect of many pain medications.  You may want to eat something before taking your pain medicine to prevent nausea. After an Interscalene nerve block, you cannot feel pain, pressure or extremes in temperature in the effected arm.  Because your arm is numb it is at an increased risk for injury.  To decrease the  possibility of injury, please practice the following:  While you are awake change the position of your arm frequently to prevent too much pressure on any one area for prolonged periods of time.  If you have a cast or tight dressing, check the color or your fingers every couple of hours.  Call your surgeon with the appearance of any discoloration (white or blue). If you are given a sling to wear before you go home, please wear it  at all times until the block has completely worn  off.  Do not get up at night without your sling. Please contact [facility] or your surgeon if you do not begin to regain sensation after 7 days from the surgery.  Anesthesia may be contacted by calling the Same Day Surgery Department, Mon. through Fri., 6 am to 4 pm at [phone].   If you experience any other problems or concerns, please contact your surgeon's office. If you experience severe or prolonged shortness of breath go to the nearest emergency department.

## 2021-06-12 NOTE — Anesthesia Procedure Notes (Signed)
Procedure Name: Intubation Date/Time: 06/12/2021 1:43 PM Performed by: Reece Agar, CRNA Pre-anesthesia Checklist: Patient identified, Emergency Drugs available, Suction available and Patient being monitored Patient Re-evaluated:Patient Re-evaluated prior to induction Oxygen Delivery Method: Circle system utilized Preoxygenation: Pre-oxygenation with 100% oxygen Induction Type: IV induction Ventilation: Mask ventilation without difficulty Laryngoscope Size: McGraph and 4 Grade View: Grade II Tube type: Oral Tube size: 7.0 mm Number of attempts: 1 Airway Equipment and Method: Stylet and Oral airway Placement Confirmation: ETT inserted through vocal cords under direct vision, positive ETCO2 and breath sounds checked- equal and bilateral Secured at: 22 cm Tube secured with: Tape Dental Injury: Teeth and Oropharynx as per pre-operative assessment  Difficulty Due To: Difficult Airway- due to anterior larynx

## 2021-06-12 NOTE — Anesthesia Postprocedure Evaluation (Signed)
Anesthesia Post Note  Patient: Gabriel Crawford  Procedure(s) Performed: Shoulder arthroscopy with debridement, decompression, distal clavicle excision, biceps tenodesis and rotator cuff repair (Right: Shoulder)  Patient location during evaluation: PACU Anesthesia Type: General Level of consciousness: awake and alert Pain management: pain level controlled Vital Signs Assessment: post-procedure vital signs reviewed and stable Respiratory status: spontaneous breathing, nonlabored ventilation and respiratory function stable Cardiovascular status: blood pressure returned to baseline and stable Postop Assessment: no apparent nausea or vomiting Anesthetic complications: no   No notable events documented.   Last Vitals:  Vitals:   06/12/21 1632 06/12/21 1712  BP: 124/82 113/82  Pulse: 85 72  Resp: 16 16  Temp: 36.4 C   SpO2: 95% 97%    Last Pain:  Vitals:   06/12/21 1712  TempSrc:   PainSc: 0-No pain                 Foye Deer

## 2021-06-12 NOTE — Anesthesia Procedure Notes (Signed)
Anesthesia Regional Block: Interscalene brachial plexus block   Pre-Anesthetic Checklist: , timeout performed,  Correct Patient, Correct Site, Correct Laterality,  Correct Procedure, Correct Position, site marked,  Risks and benefits discussed,  Surgical consent,  Pre-op evaluation,  At surgeon's request and post-op pain management  Laterality: Upper and Right  Prep: chloraprep       Needles:  Injection technique: Single-shot  Needle Type: Stimiplex     Needle Length: 5cm  Needle Gauge: 22   Needle insertion depth: 3 cm   Additional Needles:   Procedures:, nerve stimulator,,, ultrasound used (permanent image in chart),,   Motor weakness within 2 minutes.   Nerve Stimulator or Paresthesia:  Response: R Forearm, 0.6 mA, 2 ms, 3 cm  Additional Responses:   Narrative:  Start time: 06/12/2021 12:40 PM End time: 06/12/2021 12:47 PM Injection made incrementally with aspirations every 5 mL.  Performed by: Personally  Anesthesiologist: Manfred Arch, MD  Additional Notes: Patient consented for risk and benefits of nerve block including but not limited to nerve damage, failed block, bleeding and infection.  Patient voiced understanding.  Functioning IV was confirmed and monitors were applied.  Timeout done prior to procedure and prior to any sedation being given to the patient.  Patient confirmed procedure site prior to any sedation given to the patient.  A 78mm 22ga Stimuplex needle was used. Sterile prep,hand hygiene and sterile gloves were used.  Minimal sedation used for procedure.  IVCS With Versed 2mg  + Fent 100mg c. No paresthesia endorsed by patient during the procedure.  Negative aspiration and negative test dose prior to incremental administration of local anesthetic. The patient tolerated the procedure well with no immediate complications.  Injectate:  0.5% Bupivacaine 10 ml + Exparel 20 ml

## 2021-06-12 NOTE — Anesthesia Preprocedure Evaluation (Signed)
Anesthesia Evaluation  Patient identified by MRN, date of birth, ID band Patient awake    Reviewed: Allergy & Precautions, NPO status , Patient's Chart, lab work & pertinent test results  Airway Mallampati: III  TM Distance: >3 FB Neck ROM: Limited    Dental  (+) Teeth Intact   Pulmonary sleep apnea and Continuous Positive Airway Pressure Ventilation ,    Pulmonary exam normal        Cardiovascular + dysrhythmias  Rhythm:Regular Rate:Normal     Neuro/Psych  Headaches, Anxiety    GI/Hepatic negative GI ROS, Neg liver ROS,   Endo/Other  diabetes, Well Controlled, Type 2, Oral Hypoglycemic Agents  Renal/GU negative Renal ROS  negative genitourinary   Musculoskeletal  (+) Arthritis , Osteoarthritis,    Abdominal (+) + obese,   Peds  Hematology negative hematology ROS (+)   Anesthesia Other Findings Anxiety    Arrhythmia    Headache  migraines - OTC med prn  Hearing loss  wears hearing aids History of kidney stones   HLD (hyperlipidemia)    Low back pain    Lung nodules    Pre-diabetes  diet controlled  Right bundle branch block (RBBB) 1978 Sleep apnea       Reproductive/Obstetrics                            Anesthesia Physical  Anesthesia Plan  ASA: 3  Anesthesia Plan: General   Post-op Pain Management: Regional block   Induction: Intravenous  PONV Risk Score and Plan: 2 and Ondansetron, Dexamethasone, Midazolam and Treatment may vary due to age or medical condition  Airway Management Planned: Oral ETT and Video Laryngoscope Planned  Additional Equipment:   Intra-op Plan:   Post-operative Plan: Extubation in OR  Informed Consent: I have reviewed the patients History and Physical, chart, labs and discussed the procedure including the risks, benefits and alternatives for the proposed anesthesia with the patient or authorized representative who has indicated his/her  understanding and acceptance.     Dental advisory given  Plan Discussed with: CRNA, Anesthesiologist and Surgeon  Anesthesia Plan Comments: (Plan for mac with advancement to GA if needed.)       Anesthesia Quick Evaluation

## 2021-06-13 ENCOUNTER — Encounter: Payer: Self-pay | Admitting: Surgery

## 2021-10-03 ENCOUNTER — Other Ambulatory Visit: Payer: Self-pay | Admitting: Surgery

## 2021-10-05 ENCOUNTER — Encounter: Admission: RE | Admit: 2021-10-05 | Payer: Medicare Other | Source: Ambulatory Visit

## 2021-10-08 ENCOUNTER — Other Ambulatory Visit: Payer: Self-pay | Admitting: Surgery

## 2021-10-09 ENCOUNTER — Encounter: Admission: RE | Payer: Self-pay | Source: Ambulatory Visit

## 2021-10-09 ENCOUNTER — Ambulatory Visit: Admission: RE | Admit: 2021-10-09 | Payer: Medicare Other | Source: Ambulatory Visit | Admitting: Surgery

## 2021-10-09 SURGERY — EXCISION MASS UPPER EXTREMITIES
Anesthesia: Choice | Site: Shoulder | Laterality: Right

## 2021-10-16 ENCOUNTER — Other Ambulatory Visit
Admission: RE | Admit: 2021-10-16 | Discharge: 2021-10-16 | Disposition: A | Discharge status: Home / Self Care | Payer: Medicare Other | Source: Ambulatory Visit | Attending: Surgery | Admitting: Surgery

## 2021-10-16 ENCOUNTER — Other Ambulatory Visit: Payer: Self-pay

## 2021-10-16 NOTE — Patient Instructions (Addendum)
?Your procedure is scheduled on: Tuesday October 30, 2021. ?Report to Day Surgery inside [facility] 2nd floor, stop by admissions desk before getting on elevator.  ?To find out your arrival time please call [phone] between 1PM - 3PM on Monday October 29, 2021. ? ?Remember: Instructions that are not followed completely may result in serious medical risk,  ?up to and including death, or upon the discretion of your surgeon and anesthesiologist your  ?surgery may need to be rescheduled.  ? ?  _X__ 1. Do not eat food after midnight the night before your procedure. ?                No chewing gum or hard candies. You may drink clear liquids up to 2 hours ?                before you are scheduled to arrive for your surgery- DO not drink clear ?                liquids within 2 hours of the start of your surgery. ?                Clear Liquids include:  water, apple juice without pulp, clear Gatorade, G2 or  ?                Gatorade Zero (avoid Red/Purple/Blue), Black Coffee or Tea (Do not add ?                anything to coffee or tea). ? ?__X___2.   Complete the "Ensure Clear Pre-surgery Clear Carbohydrate Drink" provided to you, 2 hours before arrival. **If you are diabetic you will be provided with an alternative drink, Gatorade Zero or G2. ? ?__X__2.  On the morning of surgery brush your teeth with toothpaste and water, you ?               may rinse your mouth with mouthwash if you wish.  Do not swallow any toothpaste or mouthwash. ?   ? _X__ 3.  No Alcohol for 24 hours before or after surgery. ? ? _X__ 4.  Do Not Smoke or use e-cigarettes For 24 Hours Prior to Your Surgery. ?                Do not use any chewable tobacco products for at least 6 hours prior to ?                Surgery. ? ?_X__  5.  Do not use any recreational drugs (marijuana, cocaine, heroin, ecstasy, MDMA or other) ?               For at least one week prior to your surgery.  Combination of these drugs with anesthesia ?                May have life threatening results. ? ?____  6.  Bring all medications with you on the day of surgery if instructed.  ? ?__X__  7.  Notify your doctor if there is any change in your medical condition  ?    (cold, fever, infections). ?    ?Do not wear jewelry, make-up, hairpins, clips or nail polish. ?Do not wear lotions, powders, or perfumes, deodorant. ?Do not shave 48 hours prior to surgery. Men may shave face and neck. ?Do not bring valuables to the hospital.   ? ?[facility] is not responsible for any belongings or valuables. ? ?Contacts, dentures  or bridgework may not be worn into surgery. ?Leave your suitcase in the car. After surgery it may be brought to your room. ?For patients admitted to the hospital, discharge time is determined by your ?treatment team. ?  ?Patients discharged the day of surgery will not be allowed to drive home.   ?Make arrangements for someone to be with you for the first 24 hours of your ?Same Day Discharge. ? ? ?__X__ Take these medicines the morning of surgery with A SIP OF WATER:  ? ? 1. None  ? 2.  ? 3.  ? 4. ? 5. ? 6. ? ?____ Fleet Enema (as directed)  ? ?__X__ Use CHG Soap (or wipes) as directed ? ?____ Use Benzoyl Peroxide Gel as instructed ? ?____ Use inhalers on the day of surgery ? ?____ Stop metformin 2 days prior to surgery   ? ?____ Take 1/2 of usual insulin dose the night before surgery. No insulin the morning ?         of surgery.  ? ?____ Call your PCP, cardiologist, or Pulmonologist if taking Coumadin/Plavix/aspirin and ask when to stop before your surgery.  ? ?__X__ One Week prior to surgery- Stop Anti-inflammatories such as Ibuprofen, Aleve, Advil, Motrin, meloxicam (MOBIC), diclofenac, etodolac, ketorolac, Toradol, Daypro, piroxicam, Goody's or BC powders. OK TO USE TYLENOL IF NEEDED ?  ?__X__ Stop ALL supplements until after surgery.   ? ?____ Bring C-Pap to the hospital.  ? ? ?If you have any questions regarding your pre-procedure instructions,  ?Please  call Pre-admit Testing at [phone] ?

## 2021-10-29 MED ORDER — CEFAZOLIN SODIUM-DEXTROSE 2-4 GM/100ML-% IV SOLN
2.0000 g | INTRAVENOUS | Status: AC
  Administered 2021-10-30: 2 g via INTRAVENOUS

## 2021-10-29 MED ORDER — LACTATED RINGERS IV SOLN: INTRAVENOUS | Status: DC

## 2021-10-29 MED ORDER — FAMOTIDINE 20 MG PO TABS: 20.0000 mg | ORAL_TABLET | Freq: Once | ORAL | Status: AC

## 2021-10-29 MED ORDER — CHLORHEXIDINE GLUCONATE 0.12 % MT SOLN: 15.0000 mL | Freq: Once | OROMUCOSAL | Status: AC

## 2021-10-29 MED ORDER — ORAL CARE MOUTH RINSE: 15.0000 mL | Freq: Once | OROMUCOSAL | Status: AC

## 2021-10-30 ENCOUNTER — Encounter
Admission: RE | Disposition: A | Discharge status: Home / Self Care | Payer: Self-pay | Source: Ambulatory Visit | Attending: Surgery

## 2021-10-30 ENCOUNTER — Other Ambulatory Visit: Payer: Self-pay

## 2021-10-30 ENCOUNTER — Ambulatory Visit
Admission: RE | Admit: 2021-10-30 | Discharge: 2021-10-30 | Disposition: A | Discharge status: Home / Self Care | Payer: Medicare Other | Source: Ambulatory Visit | Attending: Surgery | Admitting: Surgery

## 2021-10-30 ENCOUNTER — Ambulatory Visit: Payer: Medicare Other | Admitting: Anesthesiology

## 2021-10-30 ENCOUNTER — Encounter: Payer: Self-pay | Admitting: Surgery

## 2021-10-30 DIAGNOSIS — G8918 Other acute postprocedural pain: Secondary | ICD-10-CM | POA: Insufficient documentation

## 2021-10-30 DIAGNOSIS — L905 Scar conditions and fibrosis of skin: Secondary | ICD-10-CM | POA: Insufficient documentation

## 2021-10-30 DIAGNOSIS — M7501 Adhesive capsulitis of right shoulder: Secondary | ICD-10-CM | POA: Insufficient documentation

## 2021-10-30 HISTORY — PX: EXCISION MASS UPPER EXTREMETIES: SHX6704

## 2021-10-30 SURGERY — EXCISION MASS UPPER EXTREMITIES
Anesthesia: General | Site: Shoulder | Laterality: Right

## 2021-10-30 MED ORDER — EPHEDRINE SULFATE (PRESSORS) 50 MG/ML IJ SOLN
INTRAMUSCULAR | Status: DC | PRN
  Administered 2021-10-30 (×2): 5 mg via INTRAVENOUS

## 2021-10-30 MED ORDER — DEXAMETHASONE SODIUM PHOSPHATE 10 MG/ML IJ SOLN
INTRAMUSCULAR | Status: DC | PRN
  Administered 2021-10-30: 10 mg via INTRAVENOUS

## 2021-10-30 MED ORDER — 0.9 % SODIUM CHLORIDE (POUR BTL) OPTIME
TOPICAL | Status: DC | PRN
  Administered 2021-10-30: 500 mL

## 2021-10-30 MED ORDER — MIDAZOLAM HCL 2 MG/2ML IJ SOLN
INTRAMUSCULAR | Status: DC | PRN
  Administered 2021-10-30: 2 mg via INTRAVENOUS

## 2021-10-30 MED ORDER — FENTANYL CITRATE (PF) 100 MCG/2ML IJ SOLN
INTRAMUSCULAR | Status: AC
  Filled 2021-10-30: qty 2

## 2021-10-30 MED ORDER — BUPIVACAINE-EPINEPHRINE (PF) 0.5% -1:200000 IJ SOLN
INTRAMUSCULAR | Status: AC
  Filled 2021-10-30: qty 30

## 2021-10-30 MED ORDER — CHLORHEXIDINE GLUCONATE 0.12 % MT SOLN
OROMUCOSAL | Status: AC
  Administered 2021-10-30: 15 mL via OROMUCOSAL
  Filled 2021-10-30: qty 15

## 2021-10-30 MED ORDER — HYDROCODONE-ACETAMINOPHEN 5-325 MG PO TABS: 1.0000 | ORAL_TABLET | Freq: Four times a day (QID) | ORAL | 0 refills | Status: DC | PRN

## 2021-10-30 MED ORDER — FENTANYL CITRATE (PF) 100 MCG/2ML IJ SOLN
25.0000 ug | INTRAMUSCULAR | Status: DC | PRN
  Administered 2021-10-30: 50 ug via INTRAVENOUS

## 2021-10-30 MED ORDER — FENTANYL CITRATE (PF) 100 MCG/2ML IJ SOLN
INTRAMUSCULAR | Status: DC | PRN
  Administered 2021-10-30: 100 ug via INTRAVENOUS

## 2021-10-30 MED ORDER — PROPOFOL 10 MG/ML IV BOLUS
INTRAVENOUS | Status: DC | PRN
  Administered 2021-10-30: 150 mg via INTRAVENOUS

## 2021-10-30 MED ORDER — ONDANSETRON HCL 4 MG/2ML IJ SOLN: 4.0000 mg | Freq: Once | INTRAMUSCULAR | Status: DC | PRN

## 2021-10-30 MED ORDER — ONDANSETRON HCL 4 MG PO TABS
4.0000 mg | ORAL_TABLET | Freq: Four times a day (QID) | ORAL | Status: DC | PRN
Start: 2021-10-30 — End: 2021-10-30

## 2021-10-30 MED ORDER — BUPIVACAINE-EPINEPHRINE 0.5% -1:200000 IJ SOLN
INTRAMUSCULAR | Status: DC | PRN
  Administered 2021-10-30: 30 mL

## 2021-10-30 MED ORDER — ONDANSETRON HCL 4 MG/2ML IJ SOLN
INTRAMUSCULAR | Status: DC | PRN
  Administered 2021-10-30: 4 mg via INTRAVENOUS

## 2021-10-30 MED ORDER — ACETAMINOPHEN 10 MG/ML IV SOLN
INTRAVENOUS | Status: DC | PRN
  Administered 2021-10-30: 1000 mg via INTRAVENOUS

## 2021-10-30 MED ORDER — SODIUM CHLORIDE 0.9 % IV SOLN: INTRAVENOUS | Status: DC

## 2021-10-30 MED ORDER — ACETAMINOPHEN 10 MG/ML IV SOLN: 1000.0000 mg | Freq: Once | INTRAVENOUS | Status: DC | PRN

## 2021-10-30 MED ORDER — HYDROCODONE-ACETAMINOPHEN 5-325 MG PO TABS: 1.0000 | ORAL_TABLET | ORAL | Status: DC | PRN

## 2021-10-30 MED ORDER — KETOROLAC TROMETHAMINE 30 MG/ML IJ SOLN
30.0000 mg | Freq: Once | INTRAMUSCULAR | Status: AC
Start: 2021-10-30 — End: 2021-10-30

## 2021-10-30 MED ORDER — SUGAMMADEX SODIUM 500 MG/5ML IV SOLN
INTRAVENOUS | Status: DC | PRN
Start: 2021-10-30 — End: 2021-10-30
  Administered 2021-10-30: 200 mg via INTRAVENOUS

## 2021-10-30 MED ORDER — GLYCOPYRROLATE 0.2 MG/ML IJ SOLN
INTRAMUSCULAR | Status: DC | PRN
Start: 2021-10-30 — End: 2021-10-30
  Administered 2021-10-30: .2 mg via INTRAVENOUS

## 2021-10-30 MED ORDER — LIDOCAINE HCL (CARDIAC) PF 100 MG/5ML IV SOSY
PREFILLED_SYRINGE | INTRAVENOUS | Status: DC | PRN
Start: 2021-10-30 — End: 2021-10-30
  Administered 2021-10-30: 100 mg via INTRAVENOUS

## 2021-10-30 MED ORDER — ONDANSETRON HCL 4 MG/2ML IJ SOLN: 4.0000 mg | Freq: Four times a day (QID) | INTRAMUSCULAR | Status: DC | PRN

## 2021-10-30 MED ORDER — KETOROLAC TROMETHAMINE 30 MG/ML IJ SOLN
INTRAMUSCULAR | Status: AC
  Administered 2021-10-30: 30 mg via INTRAVENOUS
  Filled 2021-10-30: qty 1

## 2021-10-30 MED ORDER — ROCURONIUM BROMIDE 100 MG/10ML IV SOLN
INTRAVENOUS | Status: DC | PRN
  Administered 2021-10-30: 10 mg via INTRAVENOUS
  Administered 2021-10-30: 50 mg via INTRAVENOUS

## 2021-10-30 MED ORDER — FAMOTIDINE 20 MG PO TABS
ORAL_TABLET | ORAL | Status: AC
  Administered 2021-10-30: 20 mg via ORAL
  Filled 2021-10-30: qty 1

## 2021-10-30 MED ORDER — METOCLOPRAMIDE HCL 5 MG/ML IJ SOLN: 5.0000 mg | Freq: Three times a day (TID) | INTRAMUSCULAR | Status: DC | PRN

## 2021-10-30 MED ORDER — ACETAMINOPHEN 10 MG/ML IV SOLN
INTRAVENOUS | Status: AC
  Filled 2021-10-30: qty 100

## 2021-10-30 MED ORDER — METOCLOPRAMIDE HCL 10 MG PO TABS: 5.0000 mg | ORAL_TABLET | Freq: Three times a day (TID) | ORAL | Status: DC | PRN

## 2021-10-30 MED ORDER — CEFAZOLIN SODIUM-DEXTROSE 2-4 GM/100ML-% IV SOLN
INTRAVENOUS | Status: AC
  Filled 2021-10-30: qty 100

## 2021-10-30 MED ORDER — FENTANYL CITRATE (PF) 100 MCG/2ML IJ SOLN
INTRAMUSCULAR | Status: AC
  Administered 2021-10-30: 50 ug via INTRAVENOUS
  Filled 2021-10-30: qty 2

## 2021-10-30 MED ORDER — BUPIVACAINE LIPOSOME 1.3 % IJ SUSP
INTRAMUSCULAR | Status: DC | PRN
  Administered 2021-10-30: 20 mL

## 2021-10-30 MED ORDER — MIDAZOLAM HCL 2 MG/2ML IJ SOLN
INTRAMUSCULAR | Status: AC
  Filled 2021-10-30: qty 2

## 2021-10-30 MED ORDER — PHENYLEPHRINE HCL (PRESSORS) 10 MG/ML IV SOLN
INTRAVENOUS | Status: DC | PRN
  Administered 2021-10-30 (×9): 80 ug via INTRAVENOUS

## 2021-10-30 SURGICAL SUPPLY — 16 items
CHLORAPREP W/TINT 26 (MISCELLANEOUS) ×2 IMPLANT
ELECT REM PT RETURN 9FT ADLT (ELECTROSURGICAL) ×2
ELECTRODE REM PT RTRN 9FT ADLT (ELECTROSURGICAL) ×1 IMPLANT
GLOVE SURG ENC MOIS LTX SZ8 (GLOVE) ×2 IMPLANT
GLOVE SURG UNDER LTX SZ8 (GLOVE) ×2 IMPLANT
GOWN STRL REUS W/ TWL LRG LVL3 (GOWN DISPOSABLE) ×1 IMPLANT
GOWN STRL REUS W/ TWL XL LVL3 (GOWN DISPOSABLE) ×1 IMPLANT
GOWN STRL REUS W/TWL LRG LVL3 (GOWN DISPOSABLE) ×1
GOWN STRL REUS W/TWL XL LVL3 (GOWN DISPOSABLE) ×1
KIT TURNOVER KIT A (KITS) ×2 IMPLANT
MANIFOLD NEPTUNE II (INSTRUMENTS) ×2 IMPLANT
NS IRRIG 500ML POUR BTL (IV SOLUTION) ×2 IMPLANT
PACK ARTHROSCOPY SHOULDER (MISCELLANEOUS) ×1 IMPLANT
PACK SHOULDER (CUSTOM PROCEDURE TRAY) IMPLANT
STAPLER SKIN PROX 35W (STAPLE) ×1 IMPLANT
SUT VIC AB 2-0 CT1 (SUTURE) ×2 IMPLANT

## 2021-10-30 NOTE — Discharge Instructions (Addendum)
Orthopedic discharge instructions: ?May shower with intact OpSite dressing.  ?Apply ice frequently to shoulder. ?Take ibuprofen 600-800 mg TID with meals for 5-7 days, then as necessary. ?Take pain medication as prescribed when needed.  ?May supplement with ES Tylenol if necessary. ?Use sling as necessary for comfort, especially for the first 3 to 5 days. ?Follow-up in 10-14 days or as scheduled.AMBULATORY SURGERY  ?DISCHARGE INSTRUCTIONS ? ? ?The drugs that you were given will stay in your system until tomorrow so for the next 24 hours you should not: ? ?Drive an automobile ?Make any legal decisions ?Drink any alcoholic beverage ? ? ?You may resume regular meals tomorrow.  Today it is better to start with liquids and gradually work up to solid foods. ? ?You may eat anything you prefer, but it is better to start with liquids, then soup and crackers, and gradually work up to solid foods. ? ? ?Please notify your doctor immediately if you have any unusual bleeding, trouble breathing, redness and pain at the surgery site, drainage, fever, or pain not relieved by medication. ? ?  ? ?Your post-operative visit with Dr.                     ? ? ?           ?     is: Date:                        Time:   ? ?Please call to schedule your post-operative visit. ? ?Additional Instructions:  ? ? ? ? ? ? ? ? ? ? ? ? ? ? ? ?

## 2021-10-30 NOTE — Anesthesia Preprocedure Evaluation (Signed)
Anesthesia Evaluation  ?Patient identified by MRN, date of birth, ID band ?Patient awake ? ? ? ?Reviewed: ?Allergy & Precautions, NPO status , Patient's Chart, lab work & pertinent test results ? ?History of Anesthesia Complications ?Negative for: history of anesthetic complications ? ?Airway ?Mallampati: III ? ?TM Distance: >3 FB ?Neck ROM: Full ? ? ? Dental ?no notable dental hx. ?(+) Teeth Intact ?  ?Pulmonary ?sleep apnea and Continuous Positive Airway Pressure Ventilation , neg COPD, Patient abstained from smoking.Not current smoker,  ?  ?Pulmonary exam normal ?breath sounds clear to auscultation ? ? ? ? ? ? Cardiovascular ?Exercise Tolerance: Good ?METS(-) hypertension(-) CAD and (-) Past MI negative cardio ROS ? ?+ dysrhythmias  ?Rhythm:Regular Rate:Normal ?- Systolic murmurs ? ?  ?Neuro/Psych ? Headaches, PSYCHIATRIC DISORDERS Anxiety   ? GI/Hepatic ?neg GERD  ,(+)  ?  ? (-) substance abuse ? ,   ?Endo/Other  ?neg diabetes ? Renal/GU ?negative Renal ROS  ? ?  ?Musculoskeletal ? ? Abdominal ?  ?Peds ? Hematology ?  ?Anesthesia Other Findings ?Past Medical History: ?No date: Anxiety ?No date: Arrhythmia ?No date: Headache ?    Comment:  migraines - OTC med prn ?No date: Hearing loss ?    Comment:  wears hearing aids ?No date: History of kidney stones ?No date: HLD (hyperlipidemia) ?No date: Low back pain ?No date: Lung nodules ?No date: Pre-diabetes ?    Comment:  diet controlled ?1978: Right bundle branch block (RBBB) ?No date: Sleep apnea ?    Comment:  uses cpap ? Reproductive/Obstetrics ? ?  ? ? ? ? ? ? ? ? ? ? ? ? ? ?  ?  ? ? ? ? ? ? ? ? ?Anesthesia Physical ?Anesthesia Plan ? ?ASA: 2 ? ?Anesthesia Plan: General  ? ?Post-op Pain Management: Ofirmev IV (intra-op)*  ? ?Induction: Intravenous ? ?PONV Risk Score and Plan: 2 and Ondansetron, Dexamethasone and Midazolam ? ?Airway Management Planned: Oral ETT ? ?Additional Equipment: None ? ?Intra-op Plan:  ? ?Post-operative  Plan: Extubation in OR ? ?Informed Consent: I have reviewed the patients History and Physical, chart, labs and discussed the procedure including the risks, benefits and alternatives for the proposed anesthesia with the patient or authorized representative who has indicated his/her understanding and acceptance.  ? ? ? ?Dental advisory given ? ?Plan Discussed with: CRNA and Surgeon ? ?Anesthesia Plan Comments: (Discussed risks of anesthesia with patient, including PONV, sore throat, lip/dental/eye damage. Rare risks discussed as well, such as cardiorespiratory and neurological sequelae, and allergic reactions. Discussed the role of CRNA in patient's perioperative care. Patient understands.)  ? ? ? ? ? ? ?Anesthesia Quick Evaluation ? ?

## 2021-10-30 NOTE — H&P (Signed)
History of Present Illness: ?Gabriel Crawford is a 71 y.o. who presents today presents today for a history and physical. He is to undergo a right shoulder arthroscopic debridement on 10/30/2021. Patient is now for months status post an extensive arthroscopic debridement, arthroscopic subacromial decompression, arthroscopic excision of the distal clavicle, mini-open rotator cuff repair with application of a [employer] Regeneten patch, and mini-open biceps tenodesis of his right shoulder. The patient is somewhat frustrated by residual pain he experiences in the anterior aspect of his shoulder. Otherwise, he is quite pleased with his improvement in range of motion and strength. He rates his pain at 6/10 on today's visit, and continues to take hydrocodone and apply ice and Lidoderm patches as necessary with limited benefit. He continues to attend physical therapy, as well as to perform exercises on his own at home. He denies any reinjury to the shoulder, and denies any fevers or chills. He has difficulty sleeping at night if he lies on his left side and allows his right arm to fall forward across his chest. Has had trigger point injections as well as extensive physical therapy with continued pain. ? ?Past Medical History:  ? Arrhythmia  ? Dizziness  ? Fatigue  ? Hematospermia  ? Hyperlipidemia  ? Hypertriglyceridemia  ? Leg pain  ? Low back pain  ? Lung nodules  ? Prediabetes  ? Renal stones  ? Sleep apnea  ? ?Past Surgical History: ? TONSILLECTOMY 1957  ? APPENDECTOMY 1967  ? COLONOSCOPY 1986 ([place], [place] - nml per patient)  ? ARTHROSCOPIC REPAIR ACL Right 1995  ? COLONOSCOPY 10/31/2016 (Int Hemorrhoids, Diverticulosis: CBF 10/2026)  ? Extensive arthroscopic debridement, arthroscopic subacromial decompression, arthroscopic excision of distal clavicle, mini-open rotator cuff repair, and mini-open biceps tenodesis, right shoulder Right 06/12/2021 (Dr.Vonna Brabson)  ? LIMB SPARING RESECTION HIP W/ SADDLE JOINT REPLACEMENT  ?  RENAL STONE REMOVAL  ? ?Past Family History ?Family History  ?Problem Relation Age of Onset  ? Dementia Mother  ? Cerebral aneurysm Father  ? Myocardial Infarction (Heart attack) Father  ? Aneurysm Father  ? Brain hemorrhage Father  ? Lung cancer Maternal Grandmother  ? Brain cancer Maternal Grandmother  ? Cancer Maternal Grandmother  ? Diabetes type II Maternal Grandfather  ? Stomach cancer Paternal Grandmother  ? Cancer Paternal Grandmother  ? Heart failure Paternal Grandfather  ? ?Medications: ? acetaminophen (TYLENOL) 500 mg capsule Take 500 mg by mouth every 6 (six) hours as needed for Pain  ? acetic acid (VOSOL) 2 % otic solution Place in ear(s) once as needed  ? chlorhexidine (PERIDEX) 0.12 % solution  ? cholecalciferol, vitamin D3, (VITAMIN D3) 125 mcg (5,000 unit) tablet Take 10,000 Units by mouth once daily.  ? coenzyme Q10 10 mg capsule Take 10 mg by mouth once daily.  ? cyanocobalamin (VITAMIN B12) 1000 MCG tablet Take 1,000 mcg by mouth once daily.  ? DOCOSAHEXANOIC ACID/EPA (FISH OIL ORAL) Take 1 tablet by mouth daily.  ? escitalopram oxalate (LEXAPRO) 10 MG tablet Take 1 tablet (10 mg total) by mouth once daily Need to schedule physical with PCP 90 tablet 1  ? HYDROcodone-acetaminophen (NORCO) 5-325 mg tablet Take 1 tablet by mouth 2 (two) times daily as needed for Pain 20 tablet 0  ? hydrocodone-homatropine (HYCODAN) 5-1.5 mg/5 mL syrup Take 5 mLs by mouth every 6 (six) hours as needed 100 mL 0  ? ibuprofen 200 mg Cap Take 800 mg by mouth as needed  ? lidocaine (LIDODERM) 5 % patch UNWRAP AND  APPLY 1 PATCH TO MOST PAINFUL AREA UP TO 12 HOURS IN A 24 HOUR PERIOD. 90 patch 1  ? lutein 40 mg Cap Take 40 mg by mouth once daily  ? multivitamin tablet Take 1 tablet by mouth daily.  ? SAW PALMETTO ORAL Take 1 tablet by mouth daily.  ? triamcinolone 0.1 % cream Apply topically once as needed  ? ?Allergies: ? Codeine Headache and Tinnitus  ? Sumatriptan Swelling and Tongue swelling  ? Oxycodone-Acetaminophen  Tinnitus and Headache  ? ?Review of Systems: ?A comprehensive 14 point ROS was performed, reviewed, and the pertinent orthopaedic findings are documented in the HPI. ? ?Physical Exam: ?BP 122/88 (BP Location: Left upper arm, Patient Position: Sitting, BP Cuff Size: Adult)  Ht 177.8 cm (5\' 10" )  Wt (!) 102.2 kg (225 lb 3.2 oz)  BMI 32.31 kg/m?  ? ?General: ?Well-developed well-nourished male seen in no acute distress.  ? ?HEENT: ?Atraumatic,normocephalic. Pupils are equal and reactive to light. Oropharynx is clear with moist mucosa ? ?Lungs: ?Clear to auscultation bilaterally  ? ?Cardiovascular: ?Regular rate and rhythm. Normal S1, S2. No murmurs. No appreciable gallops or rubs. Peripheral pulses are palpable. ? ?Abdomen: ?Soft, non-tender, nondistended. Bowel sounds present ? ?Right shoulder exam: ?On inspection, his surgical incision is well-healed and without evidence for infection.  No swelling, erythema, ecchymosis, abrasions, or other skin abnormalities are identified. There is moderate focal tenderness to palpation over the anterior aspect of the shoulder at the inferior end of the surgical incision, but otherwise there are no other areas of tenderness around the shoulder. Actively, he exhibits near full range of motion of the shoulder as he can forward flex to 160 degrees, abduct to 155 degrees, and internally rotate to L1, all without pain.  Passively, he is Gabriel to tolerate forward flexion to 165 degrees and abduction to 160 degrees.  At 90 degrees of abduction, he is Gabriel to tolerate external rotation to 90 degrees and internal rotation to 70 degrees.  He notes minimal "tightness" with maximal external rotation at 90 degrees of abduction.  He exhibits 4-4+/5 strength with resisted forward flexion and abduction, and 4+/5 strength with resisted internal and external rotation. He remains neurovascularly intact to the right upper extremity and hand. ? ?Neurological: ?The patient is alert and  oriented ?Sensation to light touch appears to be intact and within normal limits ?Gross motor strength appeared to be equal to 5/5 ? ?Vascular : ?Peripheral pulses felt to be palpable. ?Capillary refill appears to be intact and within normal limits ? ?Impression: ?1. Status post right rotator cuff repair ?2. Status post resection distal clavicle right shoulder ?3. Pain postop felt to be secondary to scar tissue and retained suture material anterior aspect of right shoulder ? ?Plan:  ?The treatment options, including both surgical and nonsurgical choices, have been discussed in detail with the patient and his wife wife. He is ready to proceed with a surgical procedure to include an exploration of the anterior shoulder wound with debridement of scar tissue and presumed painful retained suture material. The risks (including bleeding, infection, nerve and/or blood vessel injury, persistent or recurrent pain, weakness of the shoulder, recurrent rotator cuff pathology, need for further surgery, blood clots, strokes, heart attacks or arrhythmias, pneumonia, etc.) and benefits of the surgical procedure were discussed. The patient states his/her understanding and agrees to proceed. A formal written consent will be obtained by the nursing staff. ? ? ?H&P reviewed and patient re-examined. No changes. ? ?

## 2021-10-30 NOTE — Anesthesia Procedure Notes (Signed)
Procedure Name: Intubation ?Date/Time: 10/30/2021 11:16 AM ?Performed by: Lorie Apley, CRNA ?Pre-anesthesia Checklist: Patient identified, Patient being monitored, Timeout performed, Emergency Drugs available and Suction available ?Patient Re-evaluated:Patient Re-evaluated prior to induction ?Oxygen Delivery Method: Circle system utilized ?Preoxygenation: Pre-oxygenation with 100% oxygen ?Induction Type: IV induction ?Ventilation: Mask ventilation without difficulty ?Laryngoscope Size: Mac, McGraph and 4 ?Grade View: Grade II ?Tube type: Oral ?Tube size: 7.5 mm ?Number of attempts: 1 ?Airway Equipment and Method: Stylet ?Placement Confirmation: ETT inserted through vocal cords under direct vision, positive ETCO2 and breath sounds checked- equal and bilateral ?Secured at: 22 cm ?Tube secured with: Tape ?Dental Injury: Teeth and Oropharynx as per pre-operative assessment  ? ? ? ? ?

## 2021-10-30 NOTE — Progress Notes (Signed)
Left message with patients wife Zella Ball and explained revised discharge instructions. Advised to call the post-op area back if she had any questions. ?

## 2021-10-30 NOTE — Op Note (Signed)
10/30/2021 ? ?12:43 PM ? ?Patient:   Gabriel Crawford ? ?Pre-Op Diagnosis:   Painful scar tissue status post prior rotator cuff repair with excision of distal clavicle, decompression, and biceps tenodesis, right shoulder. ? ?Post-Op Diagnosis:   Same with secondary adhesive capsulitis, right shoulder. ? ?Procedure:   Open release of subacromial adhesions with debridement of scar tissue and removal of retained suture material, right shoulder. ? ?Anesthesia:   GET ? ?Surgeon:   Maryagnes Amos, MD ? ?Assistant:   Erick Colace, PA-S ? ?Findings:   As above.  The previous rotator cuff repair appeared to be well-healed.  Prior to manipulation, the right shoulder could be forward flexed to 150? and abducted to 140?Marland Kitchen At 90? of abduction, the shoulder could be externally rotated to 70? and internally rotated to 50?Marland Kitchen Following manipulation, the shoulder could be forward flexed to 165?, abducted to 160? and, at 90? of abduction, externally rotated to 85? and internally rotated to 65?. ? ?Complications:   None ? ?Fluids:   500 cc ? ?Estimated blood loss:   10 cc ? ?Tourniquet time:   None ? ?Drains:   None ? ?Closure:   Staples     ? ?Brief clinical note:   The patient is a 71 year old male who is now 4.5 months status post a right shoulder arthroscopy with extensive debridement, subacromial decompression, arthroscopic distal clavicle excision, mini open rotator cuff repair, and mini open biceps tenodesis.  Despite extensive physical therapy, he has continued to have focal anterior shoulder pain which is worse when he sleeps on his side at night as well as with cross body activities.  The patient's history and examination are consistent with painful residual scar tissue, especially as a postoperative ultrasound showed no evidence for a recurrent rotator cuff tear.  The patient presents at this time for definitive management of these shoulder symptoms. ? ?Procedure:   The patient was brought into the operating room and lain in  the supine position. The patient then underwent general endotracheal intubation and anesthesia before being repositioned in the beach chair position using the beach chair positioner. The right shoulder and upper extremity were prepped with ChloraPrep solution before being draped sterilely. Preoperative antibiotics were administered. A timeout was performed to confirm the proper surgical site before the shoulder was manipulated with the findings as described above.  Several palpable and auditory pops were heard as the scar tissue released, permitting near full range of motion of the shoulder.   ? ?Utilizing the previous anterolateral incision, the expected incision site was injected with 0.5% Sensorcaine with epinephrine.  An approximately 4-5 cm incision was made over the anterolateral aspect of the shoulder beginning at the anterolateral corner of the acromion and extending distally in line with the bicipital groove. This incision was carried down through the subcutaneous tissues to expose the deltoid fascia. The raphae between the anterior and middle thirds was identified and this plane developed to provide access into the subacromial space.  Abundant reactionary bursal tissues were debrided sharply with Metzenbaum scissors.  Two Ethibond sutures were identified floating in the subacromial space and removed.  In addition, several remnants of the blue soft tissue staples used to stabilize the [employer] Regeneten patch were identified and removed.  The rotator cuff itself was carefully inspected and found to be intact. ? ?The bicipital groove was identified by palpation and this area inspected as well.  Several retained suture fragments were identified and removed after releasing some thickened adhesions overlying  the tenodesis site. ? ?The wound was copiously irrigated with sterile saline solution before the deltoid raphae was reapproximated using 2-0 Vicryl interrupted sutures. The subcutaneous tissues were  closed in two layers using 2-0 Vicryl interrupted sutures before the skin was closed using staples. The portal sites also were closed using staples. A sterile occlusive dressing was applied to the shoulder before the arm was placed into a shoulder sling. The patient was then awakened, extubated, and returned to the recovery room in satisfactory condition after tolerating the procedure well. ?

## 2021-10-30 NOTE — Transfer of Care (Signed)
Immediate Anesthesia Transfer of Care Note ? ?Patient: Gabriel Crawford ? ?Procedure(s) Performed: EXCISION OF PAINFUL SCAR TISSUE/RETAINED SUTURE MATERIAL ANTERIOR ASPECT OF RIGHT SHOULDER (Right: Shoulder) ? ?Patient Location: PACU ? ?Anesthesia Type:General ? ?Level of Consciousness: awake, alert  and oriented ? ?Airway & Oxygen Therapy: Patient Spontanous Breathing and Patient connected to face mask oxygen ? ?Post-op Assessment: Report given to RN, Post -op Vital signs reviewed and stable and Patient moving all extremities ? ?Post vital signs: Reviewed and stable ? ?Last Vitals:  ?Vitals Value Taken Time  ?BP 141/99 10/30/21 1235  ?Temp    ?Pulse 98 10/30/21 1240  ?Resp 16 10/30/21 1240  ?SpO2 99 % 10/30/21 1240  ?Vitals shown include unvalidated device data. ? ?Last Pain:  ?Vitals:  ? 10/30/21 0856  ?TempSrc: Temporal  ?PainSc: 0-No pain  ?   ? ?  ? ?Complications: No notable events documented. ?

## 2021-10-31 NOTE — Anesthesia Postprocedure Evaluation (Signed)
Anesthesia Post Note ? ?Patient: Gabriel Crawford ? ?Procedure(s) Performed: EXCISION OF PAINFUL SCAR TISSUE/RETAINED SUTURE MATERIAL ANTERIOR ASPECT OF RIGHT SHOULDER (Right: Shoulder) ? ?Patient location during evaluation: PACU ?Anesthesia Type: General ?Level of consciousness: awake and alert ?Pain management: pain level controlled ?Vital Signs Assessment: post-procedure vital signs reviewed and stable ?Respiratory status: spontaneous breathing, nonlabored ventilation, respiratory function stable and patient connected to nasal cannula oxygen ?Cardiovascular status: blood pressure returned to baseline and stable ?Postop Assessment: no apparent nausea or vomiting ?Anesthetic complications: no ? ? ?No notable events documented. ? ? ?Last Vitals:  ?Vitals:  ? 10/30/21 1315 10/30/21 1335  ?BP: 119/71 116/71  ?Pulse: 89 97  ?Resp: 11 18  ?Temp: (!) 36.3 ?C 36.6 ?C  ?SpO2: 97% 98%  ?  ?Last Pain:  ?Vitals:  ? 10/30/21 1335  ?TempSrc: Temporal  ?PainSc: 0-No pain  ? ? ?  ?  ?  ?  ?  ?  ? ?Corinda Gubler ? ? ? ? ?

## 2022-06-12 ENCOUNTER — Emergency Department: Payer: Medicare Other

## 2022-06-12 ENCOUNTER — Emergency Department
Admission: EM | Admit: 2022-06-12 | Discharge: 2022-06-12 | Disposition: A | Discharge status: Home / Self Care | Payer: Medicare Other | Attending: Emergency Medicine | Admitting: Emergency Medicine

## 2022-06-12 ENCOUNTER — Other Ambulatory Visit: Payer: Self-pay

## 2022-06-12 DIAGNOSIS — I10 Essential (primary) hypertension: Secondary | ICD-10-CM | POA: Insufficient documentation

## 2022-06-12 DIAGNOSIS — R0789 Other chest pain: Secondary | ICD-10-CM | POA: Diagnosis present

## 2022-06-12 DIAGNOSIS — R079 Chest pain, unspecified: Secondary | ICD-10-CM

## 2022-06-12 DIAGNOSIS — R0602 Shortness of breath: Secondary | ICD-10-CM | POA: Insufficient documentation

## 2022-06-12 LAB — CBC
HCT: 46.3 % (ref 39.0–52.0)
Hemoglobin: 15.4 g/dL (ref 13.0–17.0)
MCH: 28.6 pg (ref 26.0–34.0)
MCHC: 33.3 g/dL (ref 30.0–36.0)
MCV: 86.1 fL (ref 80.0–100.0)
Platelets: 255 10*3/uL (ref 150–400)
RBC: 5.38 MIL/uL (ref 4.22–5.81)
RDW: 14 % (ref 11.5–15.5)
WBC: 5.7 10*3/uL (ref 4.0–10.5)
nRBC: 0 % (ref 0.0–0.2)

## 2022-06-12 LAB — BASIC METABOLIC PANEL
Anion gap: 6 (ref 5–15)
BUN: 11 mg/dL (ref 8–23)
CO2: 25 mmol/L (ref 22–32)
Calcium: 9.2 mg/dL (ref 8.9–10.3)
Chloride: 108 mmol/L (ref 98–111)
Creatinine, Ser: 0.92 mg/dL (ref 0.61–1.24)
GFR, Estimated: >60 mL/min (ref >60)
Glucose, Bld: 163 mg/dL — ABNORMAL HIGH (ref 70–99)
Potassium: 4 mmol/L (ref 3.5–5.1)
Sodium: 139 mmol/L (ref 135–145)

## 2022-06-12 LAB — TROPONIN I (HIGH SENSITIVITY): Troponin I (High Sensitivity): 5 ng/L (ref <18)

## 2022-06-12 MED ORDER — ASPIRIN 81 MG PO CHEW: 81.0000 mg | CHEWABLE_TABLET | Freq: Every day | ORAL | 11 refills | Status: AC

## 2022-06-12 MED ORDER — IOHEXOL 350 MG/ML SOLN
75.0000 mL | Freq: Once | INTRAVENOUS | Status: AC | PRN
  Administered 2022-06-12: 75 mL via INTRAVENOUS

## 2022-06-12 MED ORDER — ASPIRIN 81 MG PO CHEW
324.0000 mg | CHEWABLE_TABLET | Freq: Once | ORAL | Status: AC
  Administered 2022-06-12: 324 mg via ORAL
  Filled 2022-06-12: qty 4

## 2022-06-12 MED ORDER — NITROGLYCERIN 0.4 MG SL SUBL: 0.4000 mg | SUBLINGUAL_TABLET | SUBLINGUAL | 0 refills | Status: AC | PRN

## 2022-06-12 NOTE — ED Triage Notes (Signed)
Pt presents to ED from [facility] clinic for c/o of CP and jaw pain. Pt states pain started Monday morning. Pt denies cardiac HX. Pt denies SOB, N/V, pt does endorse "lots of burping".

## 2022-06-12 NOTE — ED Notes (Signed)
See triage note  Presents with some chest and jaw pain which started on Monday  Denies any SOB ,diaphoresis or n/v  Afebrile on arrival

## 2022-06-12 NOTE — Discharge Instructions (Addendum)
You were seen in the emergency department for chest pain.  Your EKG was normal.  You had a heart enzyme that was normal, do not believe that you are having a heart attack today.  You have multiple risk factors for heart attack.  It is important that you follow-up and see cardiology early next week for evaluation and further cardiac workup.  Start taking aspirin 81 mg (baby aspirin) every day until you follow-up with cardiology.  You were given a prescription for sublingual nitroglycerin that you can take 1 tablet as needed for chest pain.  Return to the emergency department if you have any worsening chest pain.

## 2022-06-12 NOTE — ED Provider Notes (Signed)
[facility] Provider Note    Event Date/Time   First MD Initiated Contact with Patient 06/12/22 1126     (approximate)   History   Chest Pain   HPI  Gabriel Crawford is a 71 y.o. male with past medical history significant for obesity, hypertension, hyperlipidemia, who presents to the emergency department with chest pain.  Chest pain started yesterday.  Described a pain that radiated to his right jaw and right side and middle of his chest.  Associated with some shortness of breath.  States that has improved since yesterday but has having ongoing chest discomfort with deep inspiration.  Denies any nausea or vomiting or diaphoresis.  No prior history of similar symptoms.  Cardiac catheterization in 2009 that showed no signs of coronary artery disease.  Denies history of DVT or PE.  [place] the beach.  States that yesterday he was having severe leg pain.  Denies any significant pain at this time.  Denies any significant cough or shortness of breath at this time.  No hemoptysis.     Physical Exam   Triage Vital Signs: ED Triage Vitals  Enc Vitals Group     BP 06/12/22 1001 (!) 157/94     Pulse Rate 06/12/22 0958 70     Resp 06/12/22 0958 17     Temp 06/12/22 0958 98.2 F (36.8 C)     Temp Source 06/12/22 0958 Oral     SpO2 06/12/22 0958 97 %     Weight 06/12/22 1000 230 lb (104.3 kg)     Height 06/12/22 1123 5\' 10"  (1.778 m)     Head Circumference --      Peak Flow --      Pain Score 06/12/22 0959 3     Pain Loc --      Pain Edu? --      Excl. in GC? --     Most recent vital signs: Vitals:   06/12/22 1001 06/12/22 1319  BP: (!) 157/94 (!) 150/88  Pulse:  70  Resp:  16  Temp:    SpO2:  97%    Physical Exam Constitutional:      Appearance: He is well-developed.  HENT:     Head: Atraumatic.  Eyes:     Conjunctiva/sclera: Conjunctivae normal.  Cardiovascular:     Rate and Rhythm: Regular rhythm.  Pulmonary:     Effort: No  respiratory distress.  Chest:     Chest wall: No tenderness.  Abdominal:     Palpations: Abdomen is soft.  Musculoskeletal:        General: Normal range of motion.     Cervical back: Normal range of motion.     Right lower leg: No tenderness. No edema.     Left lower leg: No tenderness. No edema.  Skin:    General: Skin is warm.  Neurological:     Mental Status: He is alert. Mental status is at baseline.          IMPRESSION / MDM / ASSESSMENT AND PLAN / ED COURSE  I reviewed the triage vital signs and the nursing notes.  Differential diagnosis including ACS, pneumonia, pericarditis, pulmonary embolism, pneumonia, musculoskeletal.  No headache and no change in vision or jaw claudication have a low suspicion for giant cell arteritis.  Moderate risk Wells criteria will obtain CTA to further evaluate for possible pulmonary embolism.  No active chest pain at this time.    EKG  I, 06/14/22  Ieisha Gao, the attending physician, personally viewed and interpreted this ECG.   Rate: Normal  Rhythm: Normal sinus  Axis: Normal  Intervals: Normal  ST&T Change: None  No tachycardic or bradycardic dysrhythmias while on cardiac telemetry.  RADIOLOGY I independently reviewed imaging, my interpretation of imaging: Chest x-ray-no focal findings consistent with pneumonia.  CTA showed no signs of pulmonary embolism.  No signs of pneumonia.  CTA was read as no acute findings.  Findings of coronary artery calcifications      ED Results / Procedures / Treatments   Labs (all labs ordered are listed, but only abnormal results are displayed) Labs interpreted as -   No significant leukocytosis or anemia.  Initial troponin is negative.  Clinical picture is not consistent with pericarditis.  Heart score 4, moderate risk  Labs Reviewed  BASIC METABOLIC PANEL - Abnormal; Notable for the following components:      Result Value   Glucose, Bld 163 (*)    All other components within normal limits   CBC  TROPONIN I (HIGH SENSITIVITY)  TROPONIN I (HIGH SENSITIVITY)    Moderate risk heart score.  Discussed the patient's case with cardiology on-call Dr. Azucena Cecil, recommended close outpatient follow-up early next week.  No stress testing available tomorrow since it is Thanksgiving.  Recommended starting a baby aspirin and giving a prescription for sublingual nitroglycerin for as needed chest pain.  Discussed return precautions.  Patient chest pain-free at time of discharge.   PROCEDURES:  Critical Care performed: No  Procedures  Patient's presentation is most consistent with acute presentation with potential threat to life or bodily function.   MEDICATIONS ORDERED IN ED: Medications  aspirin chewable tablet 324 mg (has no administration in time range)  iohexol (OMNIPAQUE) 350 MG/ML injection 75 mL (75 mLs Intravenous Contrast Given 06/12/22 1216)    FINAL CLINICAL IMPRESSION(S) / ED DIAGNOSES   Final diagnoses:  Chest pain, unspecified type     Rx / DC Orders   ED Discharge Orders          Ordered    Ambulatory referral to Cardiology       Comments: If you have not heard from the Cardiology office within the next 72 hours please call [phone].   06/12/22 1339    aspirin (ASPIRIN CHILDRENS) 81 MG chewable tablet  Daily        06/12/22 1339    nitroGLYCERIN (NITROSTAT) 0.4 MG SL tablet  Every 5 min PRN        06/12/22 1340             Note:  This document was prepared using Dragon voice recognition software and may include unintentional dictation errors.   Corena Herter, MD 06/12/22 1343

## 2022-08-07 NOTE — Progress Notes (Signed)
New Outpatient Visit Date: 08/08/2022  Referring Provider: Corena Herter, MD [address] [place],  [place] [address]  Chief Complaint: Chest pain  HPI:  [name] is a 72 y.o. male who is being seen today for the evaluation of chest pain at the request of Dr. Arnoldo Morale. He has a history of hyperlipidemia, migraine headaches, obstructive sleep apnea, and obesity.  He presented to the [facility] emergency department in late November chest pain that woke him up around 4:56 in the morning.  He describes a sharp pain just to the right of his sternum radiating to the back near the right shoulder blade.  He notes some associated shortness of breath, feeling like the pain was worsened by deep inspiration.  He got up and moved around some without resolution of the pain but subsequently laid back down and notes that the pain had resolved within 30 to 60 minutes of onset.  He was seen at [facility] clinic and was advised to go to the ED for further evaluation.  ED workup including EKG, high-sensitivity troponin I, and CTA chest were unrevealing.  Today, Gabriel Crawford feels well and denies any further episodes of chest pain.  He also denies shortness of breath, palpitations, lightheadedness, and edema.  He reports that he underwent cardiac catheterization in 2009 at [facility] after a physician noticed an abnormality of his earlobe (? Frank sign).  Per Mr. Mano report, the catheterization was normal.  He walks about a mile a day without any symptoms.  He has had some sporadic palpitations over the years without associated symptoms.  He continues to have almost weekly migraine headaches being managed by [facility].  --------------------------------------------------------------------------------------------------  Cardiovascular History & Procedures: Cardiovascular Problems: Chest pain  Risk Factors: Hyperlipidemia, obesity, male gender, age > 77  Cath/PCI: LHC (04/15/2008, [facility]): Normal coronary  arteries.  Normal LVEF.  CV Surgery: None  EP Procedures and Devices: None  Non-Invasive Evaluation(s): None  Recent CV Pertinent Labs: Lab Results  Component Value Date   K 4.0 06/12/2022   BUN 11 06/12/2022   CREATININE 0.92 06/12/2022   --------------------------------------------------------------------------------------------------  Past Medical History:  Diagnosis Date   Anxiety    Arrhythmia    Headache    migraines - OTC med prn   Hearing loss    wears hearing aids   History of kidney stones    HLD (hyperlipidemia)    Low back pain    Lung nodules    Pre-diabetes    diet controlled   Right bundle branch block (RBBB) 1978   Sleep apnea    uses cpap    Past Surgical History:  Procedure Laterality Date   APPENDECTOMY  1967   ARTHROSCOPIC REPAIR ACL Right 1995   COLONOSCOPY  1986   [place], [place] - normal per patient   COLONOSCOPY  10/31/2016   Internal Hemorrhoids, Diverticulosis: CBF 10/2026   EXCISION MASS UPPER EXTREMETIES Right 10/30/2021   Procedure: EXCISION OF PAINFUL SCAR TISSUE/RETAINED SUTURE MATERIAL ANTERIOR ASPECT OF RIGHT SHOULDER;  Surgeon: Christena Flake, MD;  Location: [facility] ORS;  Service: Orthopedics;  Laterality: Right;   FINGER SURGERY  1971   left index finger   RADIOLOGY WITH ANESTHESIA Right 04/19/2021   Procedure: MRI WITH ANESTHESIA - RIGHT SHOULDER WITHOUT CONTRAST;  Surgeon: Radiologist, Medication, MD;  Location: [facility] OR;  Service: Radiology;  Laterality: Right;   renal stone removal     x 3   SHOULDER ARTHROSCOPY WITH SUBACROMIAL DECOMPRESSION, ROTATOR CUFF REPAIR AND BICEP TENDON REPAIR  Right 06/12/2021   Procedure: Shoulder arthroscopy with debridement, decompression, distal clavicle excision, biceps tenodesis and rotator cuff repair;  Surgeon: Corky Mull, MD;  Location: [facility] ORS;  Service: Orthopedics;  Laterality: Right;   TONSILLECTOMY  1957    Current Meds  Medication Sig   acetaminophen (TYLENOL) 500 MG tablet Take  500-1,500 mg by mouth every 6 (six) hours as needed for moderate pain.   acetic acid 2 % otic solution Place 4 drops into both ears as needed (moist ears).   aspirin (ASPIRIN CHILDRENS) 81 MG chewable tablet Chew 1 tablet (81 mg total) by mouth daily.   Cholecalciferol (VITAMIN D) 125 MCG (5000 UT) CAPS Take 5,000 Units by mouth daily.   Coenzyme Q10 (CO Q-10) 400 MG CAPS Take 400 Units by mouth daily.   cyanocobalamin 1000 MCG tablet Take 1,000 mcg by mouth daily.   HYDROcodone-acetaminophen (NORCO/VICODIN) 5-325 MG tablet Take 1-2 tablets by mouth every 6 (six) hours as needed for moderate pain or severe pain.   ibuprofen (ADVIL) 200 MG tablet Take 800 mg by mouth every 6 (six) hours as needed for moderate pain.   lidocaine (LIDODERM) 5 % Place 1 patch onto the skin daily as needed (shoulder pain). Remove & Discard patch within 12 hours or as directed by MD   Lutein 40 MG CAPS Take 40 mg by mouth daily.   nitroGLYCERIN (NITROSTAT) 0.4 MG SL tablet Place 1 tablet (0.4 mg total) under the tongue every 5 (five) minutes as needed for chest pain.   NON FORMULARY Pt uses a cpap nighlty   Omega-3 Fatty Acids (RA FISH OIL) 900 MG CAPS Take 900 mg by mouth daily.   saw palmetto 500 MG capsule Take 500 mg by mouth daily.   triamcinolone cream (KENALOG) 0.1 % Apply 1 application. topically daily as needed (moist ears).    Allergies: Oxycodone, Codeine, and Sumatriptan  Social History   Tobacco Use   Smoking status: Never   Smokeless tobacco: Never  Vaping Use   Vaping Use: Never used  Substance Use Topics   Alcohol use: Not Currently    Comment: socially-about 2x/year   Drug use: Never    Family History  Problem Relation Age of Onset   Dementia Mother    Heart disease Neg Hx     Review of Systems: A 12-system review of systems was performed and was negative except as noted in the  HPI.  --------------------------------------------------------------------------------------------------  Physical Exam: BP (!) 140/100 (BP Location: Right Arm, Patient Position: Sitting, Cuff Size: Large)   Pulse 64   Ht 5\' 10"  (1.778 m)   Wt 226 lb (102.5 kg)   SpO2 98%   BMI 32.43 kg/m  Repeat BP: 130/84  General:  NAD. HEENT: No conjunctival pallor or scleral icterus. Neck: Supple without lymphadenopathy, thyromegaly, JVD, or HJR. No carotid bruit. Lungs: Normal work of breathing. Clear to auscultation bilaterally without wheezes or crackles. Heart: Regular rate and rhythm without murmurs, rubs, or gallops. Non-displaced PMI. Abd: Bowel sounds present. Soft, NT/ND without hepatosplenomegaly Ext: No lower extremity edema. Radial, PT, and DP pulses are 2+ bilaterally Skin: Warm and dry without rash. Neuro: CNIII-XII intact. Strength and fine-touch sensation intact in upper and lower extremities bilaterally. Psych: Normal mood and affect.  EKG: Normal sinus rhythm without abnormality.  Lab Results  Component Value Date   WBC 5.7 06/12/2022   HGB 15.4 06/12/2022   HCT 46.3 06/12/2022   MCV 86.1 06/12/2022   PLT 255 06/12/2022  Lab Results  Component Value Date   NA 139 06/12/2022   K 4.0 06/12/2022   CL 108 06/12/2022   CO2 25 06/12/2022   BUN 11 06/12/2022   CREATININE 0.92 06/12/2022   GLUCOSE 163 (H) 06/12/2022   Lipid panel (12/21/2021, [facility]): Total cholesterol 133, triglycerides 129, HDL 24, LDL 83  --------------------------------------------------------------------------------------------------  ASSESSMENT AND PLAN: Chest pain: Mr. Ndiaye had an isolated episode of right-sided chest pain at rest that lasted 30-60 minutes in late November.  He has not had any recurrent chest pain.  ED workup at that time was unremarkable.  His CT of the chest to exclude PE showed mild coronary artery calcification.  Cardiac risk factors include hyperlipidemia,  aforementioned coronary artery calcification, male gender, age, and obesity.  We have agreed to perform an exercise tolerance test to exclude obstructive CAD.  It is okay to continue low-dose aspirin.  Given normal LDL and triglycerides on most recent labs in June, we will defer adding a statin at this time.  I encouraged Mr. Slauson to continue working on lifestyle modifications.  Elevated blood pressure: Blood pressure mildly elevated on initial measurement, improved on recheck.  Defer medication changes today.  Sodium restriction and continued exercise were encouraged.  Shared Decision Making/Informed Consent The risks [chest pain, shortness of breath, cardiac arrhythmias, dizziness, blood pressure fluctuations, myocardial infarction, stroke/transient ischemic attack, and life-threatening complications (estimated to be 1 in 10,000)], benefits (risk stratification, diagnosing coronary artery disease, treatment guidance) and alternatives of an exercise tolerance test were discussed in detail with Mr. Massenburg and he agrees to proceed.  Follow-up: Return to clinic after exercise tolerance test.  Nelva Bush, MD 08/08/2022 9:24 AM

## 2022-08-08 ENCOUNTER — Encounter: Payer: Self-pay | Admitting: Internal Medicine

## 2022-08-08 ENCOUNTER — Ambulatory Visit: Payer: Medicare Other | Attending: Internal Medicine | Admitting: Internal Medicine

## 2022-08-08 VITALS — BP 130/84 | HR 64 | Ht 70.0 in | Wt 226.0 lb

## 2022-08-08 DIAGNOSIS — R030 Elevated blood-pressure reading, without diagnosis of hypertension: Secondary | ICD-10-CM | POA: Diagnosis not present

## 2022-08-08 DIAGNOSIS — R079 Chest pain, unspecified: Secondary | ICD-10-CM | POA: Diagnosis not present

## 2022-08-08 NOTE — Patient Instructions (Signed)
Medication Instructions:  Your Physician recommend you continue on your current medication as directed.    *If you need a refill on your cardiac medications before your next appointment, please call your pharmacy*   Lab Work: None ordered today   Testing/Procedures:   [facility] at [facility] [address] [place],  [address] Phone:  [phone]        You are scheduled for an Exercise Stress Test  Please arrive 15 minutes prior to your appointment time for registration and insurance purposes.  The test will take approximately 45 minutes to complete.  How to prepare for your Exercise Stress Test: Do bring a list of your current medications with you.  If not listed below, you may take your medications as normal. Do wear comfortable clothes (no dresses or overalls) and walking shoes, tennis shoes preferred (no heels or open toed shoes are allowed) Do Not wear cologne, perfume, aftershave or lotions (deodorant is allowed). Please report to [employer], [address] for your test.  If these instructions are not followed, your test will have to be rescheduled.  If you have questions or concerns about your appointment, you can call the Stress Lab at [phone].  If you cannot keep your appointment, please provide 24 hours notification to the Stress Lab, to avoid a possible $50 charge to your account    Follow-Up: At [facility], you and your health needs are our priority.  As part of our continuing mission to provide you with exceptional heart care, we have created designated Provider Care Teams.  These Care Teams include your primary Cardiologist (physician) and Advanced Practice Providers (APPs -  Physician Assistants and Nurse Practitioners) who all work together to provide you with the care you need, when you need it.  We recommend signing up for the patient portal called "MyChart".  Sign up information is  provided on this After Visit Summary.  MyChart is used to connect with patients for Virtual Visits (Telemedicine).  Patients are able to view lab/test results, encounter notes, upcoming appointments, etc.  Non-urgent messages can be sent to your provider as well.   To learn more about what you can do with MyChart, go to [email or web address].    Your next appointment:   1 month(s)  Provider:   You may see Nelva Bush, MD or one of the following Advanced Practice Providers on your designated Care Team:   Murray Hodgkins, NP Christell Faith, PA-C Cadence Kathlen Mody, PA-C Gerrie Nordmann, NP

## 2022-08-30 ENCOUNTER — Other Ambulatory Visit: Payer: Self-pay

## 2022-08-30 DIAGNOSIS — R079 Chest pain, unspecified: Secondary | ICD-10-CM

## 2022-09-04 ENCOUNTER — Ambulatory Visit: Payer: Medicare Other | Attending: Cardiology

## 2022-09-04 DIAGNOSIS — R079 Chest pain, unspecified: Secondary | ICD-10-CM | POA: Diagnosis present

## 2022-09-05 LAB — EXERCISE TOLERANCE TEST
Angina Index: 0
Base ST Depression (mm): 0 mm
Duke Treadmill Score: -1
Estimated workload: 8.5
Exercise duration (min): 7 min
Exercise duration (sec): 0 s
MPHR: 151 {beats}/min
Peak HR: 160 {beats}/min
Percent HR: 105 %
RPE: 15
Rest HR: 72 {beats}/min
ST Depression (mm): 1.5 mm

## 2022-09-12 NOTE — Progress Notes (Signed)
Follow-up Outpatient Visit Date: 09/13/2022  Primary Care Provider: Derinda Late, MD [address] [facility] [place] [address]  Chief Complaint: Follow-up stress test  HPI:  Gabriel Crawford is a 72 y.o. male with history of hyperlipidemia, migraine headaches, obstructive sleep apnea, and obesity, who presents for follow-up of chest pain.  I met him last month following ED visit for chest pain.  We agreed to perform an exercise tolerance test, which was borderline with 1.5 to 2 mm of horizontal to upsloping ST depression in the inferolateral leads.  Today, Gabriel Crawford reports that he has been feeling well other than some anxiety associated with receiving a call about his recent stress test.  His only other complaint is of some pain and soreness on the back of the left calf and thigh that occurred the day after his stress test.  He denies trauma to the area.  It is getting better.  He has not had any swelling in his legs.  He also denies chest pain, shortness of breath, and shins.  He still has sporadic transient orthostatic lightheadedness.  He has not passed out or fallen.  --------------------------------------------------------------------------------------------------  Cardiovascular History & Procedures: Cardiovascular Problems: Chest pain   Risk Factors: Hyperlipidemia, obesity, male gender, age > 11   Cath/PCI: LHC (04/15/2008, [facility]): Normal coronary arteries.  Normal LVEF.   CV Surgery: None   EP Procedures and Devices: None   Non-Invasive Evaluation(s): Exercise tolerance test (09/04/2022): Fair exercise capacity.  1.5 to 2 mm horizontal/upsloping ST depression noted in the inferolateral leads.  No arrhythmia observed.  Study was borderline.  Recent CV Pertinent Labs: Lab Results  Component Value Date   K 4.0 06/12/2022   BUN 11 06/12/2022   CREATININE 0.92 06/12/2022    Past medical and surgical history were reviewed  and updated in EPIC.  Current Meds  Medication Sig   acetaminophen (TYLENOL) 500 MG tablet Take 500-1,500 mg by mouth every 6 (six) hours as needed for moderate pain.   acetic acid 2 % otic solution Place 4 drops into both ears as needed (moist ears).   aspirin (ASPIRIN CHILDRENS) 81 MG chewable tablet Chew 1 tablet (81 mg total) by mouth daily.   Cholecalciferol (VITAMIN D) 125 MCG (5000 UT) CAPS Take 5,000 Units by mouth daily.   Coenzyme Q10 (CO Q-10) 400 MG CAPS Take 400 Units by mouth daily.   cyanocobalamin 1000 MCG tablet Take 1,000 mcg by mouth daily.   ibuprofen (ADVIL) 200 MG tablet Take 800 mg by mouth every 6 (six) hours as needed for moderate pain.   lidocaine (LIDODERM) 5 % Place 1 patch onto the skin daily as needed (shoulder pain). Remove & Discard patch within 12 hours or as directed by MD   Lutein 40 MG CAPS Take 40 mg by mouth daily.   nitroGLYCERIN (NITROSTAT) 0.4 MG SL tablet Place 1 tablet (0.4 mg total) under the tongue every 5 (five) minutes as needed for chest pain.   NON FORMULARY Pt uses a cpap nighlty   Omega-3 Fatty Acids (RA FISH OIL) 900 MG CAPS Take 900 mg by mouth daily.   saw palmetto 500 MG capsule Take 500 mg by mouth daily.   triamcinolone cream (KENALOG) 0.1 % Apply 1 application. topically daily as needed (moist ears).    Allergies: Oxycodone, Codeine, and Sumatriptan  Social History   Tobacco Use   Smoking status: Never   Smokeless tobacco: Never  Vaping Use  Vaping Use: Never used  Substance Use Topics   Alcohol use: Not Currently    Comment: socially-about 2x/year   Drug use: Never    Family History  Problem Relation Age of Onset   Dementia Mother    Heart disease Neg Hx     Review of Systems: A 12-system review of systems was performed and was negative except as noted in the HPI.  --------------------------------------------------------------------------------------------------  Physical Exam: BP (!) 138/100 (BP Location: Left  Arm, Patient Position: Sitting, Cuff Size: Large)   Pulse 73   Ht '5\' 10"'$  (1.778 m)   Wt 222 lb (100.7 kg)   SpO2 98%   BMI 31.85 kg/m  Repeat BP: 134/86  General:  NAD. Neck: No JVD or HJR. Lungs: Clear to auscultation bilaterally without wheezes or crackles. Heart: Regular rate and rhythm without murmurs, rubs, or gallops. Abdomen: Soft, nontender, nondistended. Extremities: No lower extremity edema.   Lab Results  Component Value Date   WBC 5.7 06/12/2022   HGB 15.4 06/12/2022   HCT 46.3 06/12/2022   MCV 86.1 06/12/2022   PLT 255 06/12/2022    Lab Results  Component Value Date   NA 139 06/12/2022   K 4.0 06/12/2022   CL 108 06/12/2022   CO2 25 06/12/2022   BUN 11 06/12/2022   CREATININE 0.92 06/12/2022   GLUCOSE 163 (H) 06/12/2022    No results found for: "CHOL", "HDL", "LDLCALC", "LDLDIRECT", "TRIG", "CHOLHDL"  --------------------------------------------------------------------------------------------------  ASSESSMENT AND PLAN: Precordial pain and abnormal stress test: Gabriel Crawford has not had any recurrent chest pain, though his recent exercise tolerance test was equivocal with horizontal/upsloping ST depressions in the inferolateral leads.  We have discussed further evaluation options including myocardial perfusion stress testing, coronary CTA, and cardiac catheterization, and have agreed to obtain a coronary CTA.  Continue aspirin 81 mg daily pending results.  Gabriel Crawford reports that he is taking a cholesterol medication at home, though it is not currently on this medication list nor does he recall the name.  I asked him to bring this medicine with him to his next visit with [place].  Elevated blood pressure: Initial reading mildly elevated, improved on recheck.  Defer pharmacotherapy at this time.  Continue to work on lifestyle modifications, including resuming regular exercise regimen if coronary CTA does not show critical CAD.  Follow-up: Return to clinic in 3  months.  Nelva Bush, MD 09/13/2022 9:26 AM

## 2022-09-13 ENCOUNTER — Ambulatory Visit: Payer: Medicare Other | Attending: Internal Medicine | Admitting: Internal Medicine

## 2022-09-13 ENCOUNTER — Other Ambulatory Visit
Admission: RE | Admit: 2022-09-13 | Discharge: 2022-09-13 | Disposition: A | Discharge status: Home / Self Care | Payer: Medicare Other | Attending: Internal Medicine | Admitting: Internal Medicine

## 2022-09-13 ENCOUNTER — Encounter: Payer: Self-pay | Admitting: Internal Medicine

## 2022-09-13 VITALS — BP 138/100 | HR 73 | Ht 70.0 in | Wt 222.0 lb

## 2022-09-13 DIAGNOSIS — R9439 Abnormal result of other cardiovascular function study: Secondary | ICD-10-CM | POA: Diagnosis present

## 2022-09-13 DIAGNOSIS — R072 Precordial pain: Secondary | ICD-10-CM | POA: Insufficient documentation

## 2022-09-13 DIAGNOSIS — Z01812 Encounter for preprocedural laboratory examination: Secondary | ICD-10-CM | POA: Diagnosis present

## 2022-09-13 DIAGNOSIS — R030 Elevated blood-pressure reading, without diagnosis of hypertension: Secondary | ICD-10-CM | POA: Diagnosis present

## 2022-09-13 LAB — BASIC METABOLIC PANEL
Anion gap: 10 (ref 5–15)
BUN: 13 mg/dL (ref 8–23)
CO2: 25 mmol/L (ref 22–32)
Calcium: 9.2 mg/dL (ref 8.9–10.3)
Chloride: 103 mmol/L (ref 98–111)
Creatinine, Ser: 0.98 mg/dL (ref 0.61–1.24)
GFR, Estimated: >60 mL/min (ref >60)
Glucose, Bld: 143 mg/dL — ABNORMAL HIGH (ref 70–99)
Potassium: 3.6 mmol/L (ref 3.5–5.1)
Sodium: 138 mmol/L (ref 135–145)

## 2022-09-13 MED ORDER — METOPROLOL TARTRATE 100 MG PO TABS: ORAL_TABLET | ORAL | 0 refills | Status: DC

## 2022-09-13 NOTE — Patient Instructions (Signed)
Medication Instructions:  Your Physician recommend you continue on your current medication as directed.    *If you need a refill on your cardiac medications before your next appointment, please call your pharmacy*   Lab Work: Your provider would like for you to have following labs drawn: (BMP).   Please go to the [facility] entrance and check in at the front desk.  You do not need an appointment.  They are open from 7am-6 pm.   If you have labs (blood work) drawn today and your tests are completely normal, you will receive your results only by: [place] (if you have MyChart) OR A paper copy in the mail If you have any lab test that is abnormal or we need to change your treatment, we will call you to review the results.   Testing/Procedures: Cardiac CT Angiography (CTA), is a special type of CT scan that uses a computer to produce multi-dimensional views of major blood vessels throughout the body. In CT angiography, a contrast material is injected through an IV to help visualize the blood vessels  Please see instructions below  Follow-Up: At [facility], you and your health needs are our priority.  As part of our continuing mission to provide you with exceptional heart care, we have created designated Provider Care Teams.  These Care Teams include your primary Cardiologist (physician) and Advanced Practice Providers (APPs -  Physician Assistants and Nurse Practitioners) who all work together to provide you with the care you need, when you need it.  We recommend signing up for the patient portal called "MyChart".  Sign up information is provided on this After Visit Summary.  MyChart is used to connect with patients for Virtual Visits (Telemedicine).  Patients are able to view lab/test results, encounter notes, upcoming appointments, etc.  Non-urgent messages can be sent to your provider as well.   To learn more about what you can do with MyChart, go to  [email or web address].    Your next appointment:   3 month(s)  Provider:   You may see Nelva Bush, MD or one of the following Advanced Practice Providers on your designated Care Team:   Murray Hodgkins, NP Christell Faith, PA-C Cadence Kathlen Mody, PA-C Gerrie Nordmann, NP      Your cardiac CT will be scheduled at one of the below locations:   [facility] [place], [place] [address] [phone]  [facility] [place], [place] [address] [phone]  If scheduled at [facility], please arrive at the [facility] Entrance (Entrance C2) of [facility] 30 minutes prior to test start time. You can use the FREE valet parking offered at entrance C (encouraged to control the heart rate for the test)  Proceed to the [facility] Radiology Department (first floor) to check-in and test prep.  All radiology patients and guests should use entrance C2 at [facility], accessed from [facility], even though the hospital's physical address listed is [address]    If scheduled at [facility] or [facility], please arrive 15 mins early for check-in and test prep.   Please follow these instructions carefully (unless otherwise directed):  Hold all erectile dysfunction medications at least 3 days (72 hrs) prior to test. (Ie viagra, cialis, sildenafil, tadalafil, etc) We will administer nitroglycerin during this exam.   On the Night Before the  Test: Be sure to Drink plenty of water. Do not consume any caffeinated/decaffeinated beverages or chocolate 12 hours prior to your test. Do not take any antihistamines 12 hours prior to your test.  On the Day of the Test: Drink plenty of water until 1 hour prior to the test. Do not eat any food 1 hour prior to test. You may take your regular  medications prior to the test.  Take metoprolol (Lopressor) 100 mg two hours prior to test.     After the Test: Drink plenty of water. After receiving IV contrast, you may experience a mild flushed feeling. This is normal. On occasion, you may experience a mild rash up to 24 hours after the test. This is not dangerous. If this occurs, you can take Benadryl 25 mg and increase your fluid intake. If you experience trouble breathing, this can be serious. If it is severe call 911 IMMEDIATELY. If it is mild, please call our office. If you take any of these medications: Glipizide/Metformin, Avandament, Glucavance, please do not take 48 hours after completing test unless otherwise instructed.  We will call to schedule your test 2-4 weeks out understanding that some insurance companies will need an authorization prior to the service being performed.   For non-scheduling related questions, please contact the cardiac imaging nurse navigator should you have any questions/concerns: Marchia Bond, Cardiac Imaging Nurse Navigator Gordy Clement, Cardiac Imaging Nurse Navigator [facility] Direct Office Dial: [phone]   For scheduling needs, including cancellations and rescheduling, please call [place], [phone].

## 2022-09-27 ENCOUNTER — Encounter (HOSPITAL_COMMUNITY): Payer: Self-pay

## 2022-09-30 ENCOUNTER — Ambulatory Visit
Admission: RE | Admit: 2022-09-30 | Discharge: 2022-09-30 | Disposition: A | Discharge status: Home / Self Care | Payer: Medicare Other | Source: Ambulatory Visit | Attending: Internal Medicine | Admitting: Internal Medicine

## 2022-09-30 DIAGNOSIS — R072 Precordial pain: Secondary | ICD-10-CM | POA: Insufficient documentation

## 2022-09-30 DIAGNOSIS — R9439 Abnormal result of other cardiovascular function study: Secondary | ICD-10-CM | POA: Diagnosis present

## 2022-09-30 MED ORDER — NITROGLYCERIN 0.4 MG SL SUBL
0.8000 mg | SUBLINGUAL_TABLET | Freq: Once | SUBLINGUAL | Status: AC
  Administered 2022-09-30: 0.8 mg via SUBLINGUAL

## 2022-09-30 MED ORDER — IOHEXOL 350 MG/ML SOLN
100.0000 mL | Freq: Once | INTRAVENOUS | Status: AC | PRN
  Administered 2022-09-30: 100 mL via INTRAVENOUS

## 2022-09-30 NOTE — Progress Notes (Signed)
Patient tolerated procedure well. Ambulate w/o difficulty. Denies light headedness or being dizzy. Encouraged to drink extra water today and reasoning explained. Verbalized understanding. All questions answered. ABC intact. No further needs. Discharge from procedure area w/o issues.   
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
## 2022-12-12 ENCOUNTER — Ambulatory Visit: Payer: Medicare Other | Attending: Internal Medicine | Admitting: Internal Medicine

## 2022-12-12 ENCOUNTER — Encounter: Payer: Self-pay | Admitting: Internal Medicine

## 2022-12-12 VITALS — BP 124/80 | HR 66 | Ht 71.0 in | Wt 228.0 lb

## 2022-12-12 DIAGNOSIS — I2510 Atherosclerotic heart disease of native coronary artery without angina pectoris: Secondary | ICD-10-CM | POA: Insufficient documentation

## 2022-12-12 DIAGNOSIS — W57XXXA Bitten or stung by nonvenomous insect and other nonvenomous arthropods, initial encounter: Secondary | ICD-10-CM | POA: Insufficient documentation

## 2022-12-12 NOTE — Patient Instructions (Signed)
Medication Instructions:  Your Physician recommend you continue on your current medication as directed.    *If you need a refill on your cardiac medications before your next appointment, please call your pharmacy*   Lab Work: None ordered today   Testing/Procedures: None ordered today   Follow-Up: At [facility], you and your health needs are our priority.  As part of our continuing mission to provide you with exceptional heart care, we have created designated Provider Care Teams.  These Care Teams include your primary Cardiologist (physician) and Advanced Practice Providers (APPs -  Physician Assistants and Nurse Practitioners) who all work together to provide you with the care you need, when you need it.  We recommend signing up for the patient portal called "MyChart".  Sign up information is provided on this After Visit Summary.  MyChart is used to connect with patients for Virtual Visits (Telemedicine).  Patients are able to view lab/test results, encounter notes, upcoming appointments, etc.  Non-urgent messages can be sent to your provider as well.   To learn more about what you can do with MyChart, go to [email or web address].    Your next appointment:   1 year(s)  Provider:   You may see Christopher End, MD or one of the following Advanced Practice Providers on your designated Care Team:   Christopher Berge, NP Ryan Dunn, PA-C Cadence Furth, PA-C Sheri Hammock, NP    

## 2022-12-12 NOTE — Progress Notes (Signed)
Follow-up Outpatient Visit Date: 12/12/2022  Primary Care Provider: Kandyce Rud, MD [address] [facility] [place] [address]  Chief Complaint: Right arm itching  HPI:  Gabriel Crawford is a 72 y.o. male with history of hyperlipidemia, migraine headaches, obstructive sleep apnea, and obesity, who presents for follow-up of chest pain.  I last saw him in February following exercise tolerance test that showed 1.5 to 2 mm horizontal/upsloping ST depressions in the inferolateral leads.  He was feeling well without chest pain or shortness of breath.  Due to borderline exercise tolerance test, we agreed to proceed with coronary CTA, which showed mild, nonobstructive CAD and moderate coronary artery calcium score.  Today, Gabriel Crawford reports that he has been feeling well other than itching of the right posterior arm that he attributes to a bug bite that occurred yesterday.  He has tried applying Bactine and Neosporin with some relief.  From a heart standpoint, he has been doing well.  He denies chest pain, shortness of breath, palpitations, lightheadedness, and edema.  --------------------------------------------------------------------------------------------------  Cardiovascular History & Procedures: Cardiovascular Problems: Chest pain   Risk Factors: Hyperlipidemia, obesity, male gender, age > 62   Cath/PCI: LHC (04/15/2008, [facility]): Normal coronary arteries.  Normal LVEF.   CV Surgery: None   EP Procedures and Devices: None   Non-Invasive Evaluation(s): Coronary CTA (09/30/2022): Mild proximal LAD and ramus intermedius disease (less than 25% stenosis).  Normal right dominant coronary arteries.  Coronary calcium score 56 (37th percentile for age and sex matched control).  No significant extracardiac abnormalities. Exercise tolerance test (09/04/2022): Fair exercise capacity.  1.5 to 2 mm horizontal/upsloping ST depression noted in the  inferolateral leads.  No arrhythmia observed.  Study was borderline.  Recent CV Pertinent Labs: Lab Results  Component Value Date   K 3.6 09/13/2022   BUN 13 09/13/2022   CREATININE 0.98 09/13/2022    Past medical and surgical history were reviewed and updated in EPIC.  Current Meds  Medication Sig   acetaminophen (TYLENOL) 500 MG tablet Take 500-1,500 mg by mouth every 6 (six) hours as needed for moderate pain.   aspirin (ASPIRIN CHILDRENS) 81 MG chewable tablet Chew 1 tablet (81 mg total) by mouth daily.   Cholecalciferol (VITAMIN D) 125 MCG (5000 UT) CAPS Take 5,000 Units by mouth daily.   Coenzyme Q10 (CO Q-10) 400 MG CAPS Take 400 Units by mouth daily.   cyanocobalamin 1000 MCG tablet Take 1,000 mcg by mouth daily.   ibuprofen (ADVIL) 200 MG tablet Take 800 mg by mouth every 6 (six) hours as needed for moderate pain.   lidocaine (LIDODERM) 5 % Place 1 patch onto the skin daily as needed (shoulder pain). Remove & Discard patch within 12 hours or as directed by MD   Lutein 40 MG CAPS Take 40 mg by mouth daily.   nitroGLYCERIN (NITROSTAT) 0.4 MG SL tablet Place 1 tablet (0.4 mg total) under the tongue every 5 (five) minutes as needed for chest pain.   NON FORMULARY Pt uses a cpap nighlty   saw palmetto 500 MG capsule Take 500 mg by mouth daily.    Allergies: Oxycodone, Codeine, and Sumatriptan  Social History   Tobacco Use   Smoking status: Never   Smokeless tobacco: Never  Vaping Use   Vaping Use: Never used  Substance Use Topics   Alcohol use: Not Currently    Comment: socially-about 2x/year   Drug use: Never    Family History  Problem Relation Age of Onset   Dementia Mother    Heart disease Neg Hx     Review of Systems: A 12-system review of systems was performed and was negative except as noted in the HPI.  --------------------------------------------------------------------------------------------------  Physical Exam: BP 124/80 (BP Location: Left Arm,  Patient Position: Sitting, Cuff Size: Normal)   Pulse 66   Ht 5\' 11"  (1.803 m)   Wt 228 lb (103.4 kg)   SpO2 95%   BMI 31.80 kg/m   General:  NAD. Neck: No JVD or HJR. Lungs: Clear to auscultation bilaterally without wheezes or crackles. Heart: Regular rate and rhythm without murmurs, rubs, or gallops. Abdomen: Soft, nontender, nondistended. Extremities: No lower extremity edema.  Small erythematous papule on right posterior arm.  EKG: Normal sinus rhythm without abnormalities.  Lab Results  Component Value Date   WBC 5.7 06/12/2022   HGB 15.4 06/12/2022   HCT 46.3 06/12/2022   MCV 86.1 06/12/2022   PLT 255 06/12/2022    Lab Results  Component Value Date   NA 138 09/13/2022   K 3.6 09/13/2022   CL 103 09/13/2022   CO2 25 09/13/2022   BUN 13 09/13/2022   CREATININE 0.98 09/13/2022   GLUCOSE 143 (H) 09/13/2022   Outside lipid panel (12/21/2021): Total cholesterol 133, triglycerides 129, LDL 83, HDL 24  --------------------------------------------------------------------------------------------------  ASSESSMENT AND PLAN: Coronary artery disease: Gabriel Crawford has not had any angina or dyspnea.  Recent coronary CTA showed mild plaquing without significant stenosis.  Coronary calcium score was 56 (37th percentile).  We have discussed strategies to prevent progression of disease including diet and exercise.  Most recent lipid panel available for my review showed fairly good total cholesterol and LDL (133 and 83, respectively).  Gabriel Crawford reports more recent blood work that showed his LDL was even lower.  In light of this, we will defer adding a statin at this time.  Bug bite: Continue monitoring.  If symptoms worsen, Gabriel Crawford should reach out to Dr. Pilar Plate office for further evaluation/recommendations.  Follow-up: Return to clinic in 1 year.  Yvonne Kendall, MD 12/12/2022 9:30 AM

## 2023-11-05 NOTE — Progress Notes (Signed)
 [facility] Follow-up Evaluation  Chief Complaint  Patient presents with  . Follow-up    History of Present Illness:   [name] is a 73 y.o. male who is seen in follow up for migraine headaches.    He was last seen by Dr. Darol on 04/01/23 and was continued on Ubrelvy 100mg  prn for abortive treatment.   He reports stable migraine frequency since the last visit. He is having around 4-5 per month. The headache usually resolves in 2-3 hours but can last for 1-2 days at most. If it is a milder headache, he takes Tylenol . If it is more severe, he uses Ubrelvy and lies down in a quiet, dark room. Sometimes he has to repeat a second dose.   The pain is located on the sides and top of the head and can also be behind both eyes. The pain is described as an intense pressure and throbbing. He develops photophobia, phonophobia and nausea. With worst migraines, he gets numbness in his lips and his nailbeds turn blue and could have vomiting with those.  04/01/23- Dr. Sengupta Interval events: Overall, he is doing well. He showed me his headache diary. He has on average 4 headache days per month. Sometimes, he will take tylenol , and this will help. He will call a bad headache day a day that he takes 2 ubrelvy. He typically only needs one ubrelvy.  Interval events from last visit: He reports about 3-4 headache days per month. He will take an unbrelvy even before headache attack and will take it during. It will take about 1 hour to work. He does report associated anxiety with the headache attacks. He found melatonin to not be helpful. He has taken the compazine as rescue a few times since. He will be getting a cardiac evaluation next week.  History: He reports his headaches started in 1979. 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 The notes are handwritten with some difficulties with reading them, as a result. There is some mention of him feeling shaky and having  rapid heart beat and feeling of going to die and numb all over. He had partial RBBB. January 14 1978 note mentions headache episode. There was mention that cafergot did not help and his aunt has a history of similar headaches.He had throbbing pain involving occiput and also numbness over CNV maxillary branch. He also had transient tunnel vision, like a curtain coming down, nausea, dizziness, incoordination, and tinnitus. There was concern for basilar migraine and was started on inderal. He also would get tachycardia, which may be attributed to anxiety attack per chart review. There was mention he had anxiety, episodes of tachycardia, and migraine possibly while in the [employer].  Currently, he reports his head pain is throbbing. It becomes more severe three times per month. He reports 2-3 days per week of no head pain. At other times, he has more mild head pain. He admits to light sensitivity, sound sensitivity, nausea, and vomiting with his head pain. He does have tingling of mouth, hands, and nose, which have not changed over the years. There is no positional component to his headaches. He reports he does not even try to get up. He wants to bedroom to be dark.  Current medications: Preventative: none Abortive: Ubrelvy 100mg , Zofran  4mg  ODT, compazine 5mg  (rescue- has never used)  Prior medications Ubrelvy- effective Inderal- not effective Imitrex- mention of tongue swelling  Cafergot Fioricet Nurtec Magnesium Tylenol   He reports history  of shoulder surgery. Of note, he reports his MRI brain was normal. He was scheduled for [facility] neurology. He has history of kidney stones.   Past Medical History:   Past Medical History:  Diagnosis Date  . Arrhythmia   . Dizziness   . Fatigue   . Hematospermia   . Hyperlipidemia   . Hypertriglyceridemia   . Leg pain   . Low back pain   . Lung nodules   . Prediabetes   . Renal stones   . Sleep apnea     Past Surgical History:   Past Surgical  History:  Procedure Laterality Date  . TONSILLECTOMY  1957  . APPENDECTOMY  1967  . COLONOSCOPY  1986   [place], [place] - nml per patient  . ARTHROSCOPIC REPAIR ACL Right 1995  . COLONOSCOPY  10/31/2016   Int Hemorrhoids, Diverticulosis: CBF 10/2026  . Extensive arthroscopic debridement, arthroscopic subacromial decompression, arthroscopic excision of distal clavicle, mini-open rotator cuff repair, and mini-open biceps tenodesis, right shoulder Right 06/12/2021   Dr.Poggi  . Open release of subacromial adhesions with debridement of scar tissue and removal of retained suture material, right shoulder. Right 10/30/2021   Dr. Edie  . LIMB SPARING RESECTION HIP W/ SADDLE JOINT REPLACEMENT    . RENAL STONE REMOVAL      Family History:   Family History  Problem Relation Name Age of Onset  . Dementia Mother    . Cerebral aneurysm Father    . Myocardial Infarction (Heart attack) Father    . Aneurysm Father    . Brain hemorrhage Father    . Lung cancer Maternal Grandmother    . Brain cancer Maternal Grandmother    . Cancer Maternal Grandmother    . Diabetes type II Maternal Grandfather    . Stomach cancer Paternal Grandmother    . Cancer Paternal Grandmother    . Heart failure Paternal Grandfather       Social History:   Social History   Socioeconomic History  . Marital status: Married  Tobacco Use  . Smoking status: Never    Passive exposure: Never  . Smokeless tobacco: Never  Vaping Use  . Vaping status: Never Used  Substance and Sexual Activity  . Alcohol use: Not Currently    Comment: Per patient mychart request to change alchohol use to less than 6 cans of beer a year 04/17/2022.  . Drug use: Not Currently  . Sexual activity: Yes   Social Drivers of Corporate investment banker Strain: Low Risk  (09/30/2023)   Overall Financial Resource Strain (CARDIA)   . Difficulty of Paying Living Expenses: Not hard at all  Food Insecurity: No Food Insecurity (09/30/2023)   Hunger  Vital Sign   . Worried About Programme researcher, broadcasting/film/video in the Last Year: Never true   . Ran Out of Food in the Last Year: Never true  Transportation Needs: No Transportation Needs (09/30/2023)   PRAPARE - Transportation   . Lack of Transportation (Medical): No   . Lack of Transportation (Non-Medical): No     Current Medications:   Current Outpatient Medications  Medication Sig Dispense Refill  . acetaminophen  (TYLENOL ) 500 mg capsule Take 500 mg by mouth every 6 (six) hours as needed for Pain    . atorvastatin (LIPITOR) 20 MG tablet Take 1 tablet by mouth at bedtime    . cholecalciferol, vitamin D3, (VITAMIN D3) 125 mcg (5,000 unit) tablet Take 10,000 Units by mouth once daily    . coenzyme  Q10 10 mg capsule Take 10 mg by mouth once daily    . cyanocobalamin (VITAMIN B12) 1000 MCG tablet Take 1,000 mcg by mouth once daily    . DOCOSAHEXANOIC ACID/EPA (FISH OIL ORAL) Take 1 tablet by mouth once daily    . ibuprofen 200 mg Cap Take 800 mg by mouth as needed    . lidocaine  (LIDODERM ) 5 % patch Place 1 patch onto the skin daily 90 patch 1  . lutein 40 mg Cap Take 40 mg by mouth once daily    . prochlorperazine (COMPAZINE) 5 MG tablet Take 5 mg by mouth every 6 (six) hours as needed for Nausea    . psyllium, aspartame, (METAMUCIL) 3.4 gram packet     . ubrogepant 100 mg Tab Take 100 mg by mouth as directed (Take 1 tab at head pain onset, and can take 2nd tablet after 2 hours(limit use to 200 mg in 24 hour)) 10 tablet 11   No current facility-administered medications for this visit.    Allergies:   Allergies  Allergen Reactions  . Codeine Headache and Tinnitus    Headache  . Sumatriptan Swelling    Tongue swelling  . Oxycodone-Acetaminophen  Tinnitus and Headache    Physical Exam:   VITALS --  Vitals:   11/05/23 0958  BP: 124/81  Pulse: 77  Resp: 18  SpO2: 97%  Weight: 92.5 kg (204 lb)  PainSc:   2  PainLoc: Head - Entire  General: Well nourished and in no acute distress Mental  status:  alertness: alert, orientation: person, place, time affect: normal  speech: fluent  Cranial nerves:  III, IV , VI : extra-ocular motions intact bilaterally, no ptosis  VIII: hearing intact to conversation Strength:  Able to move upper and lower extremities organically and without limitation.  Gait:  Normal, non-ataxic    Data:   Dec 2022 MRI Brain Per my review (Dr. Darol), mild white matter changes consistent with microvascular changes   Diagnosis: Migraine with aura and without status migrainosus, not intractable  (primary encounter diagnosis)   Assessment:  [name] is a 73 y.o. male who presents with migraine headaches  Episodic migraine with aura- He is not currently on a preventative medication and is having 4-5 migraines per month. He reports good efficacy with [place]. He rarely uses Zofran  for migraine related nausea. He has compazine available as a rescue medication but has never had to take it.   Plan: - Continue Ubrelvy 100mg  prn - Continue Zofran  4mg  ODT for migraine related nausea - may use compazine 5mg  prn for migraine rescue  Return in about 1 year (around 11/04/2024).  Requested Prescriptions    No prescriptions requested or ordered in this encounter   I spent a total of 22 minutes in both face-to-face and non-face-to-face activities, excluding procedures performed, for this visit on the date of this encounter.    Attestation Statement:   I personally performed the service, non-incident to. (WP)   ASHLEY MCDANIEL UNDERWOOD, PA

## 2024-01-15 NOTE — Progress Notes (Signed)
 Cardiology Office Note   Date:  01/16/2024  ID:  Gabriel Crawford, Gabriel Crawford 1950/08/01, MRN [id number] PCP: Diedra Lame, MD  [facility] Providers Cardiologist:  Lonni Hanson, MD     History of Present Illness Gabriel Crawford is a 73 y.o. male with past medical history of hyperlipidemia, migraine headaches, obstructive sleep apnea, history of atypical chest pain, and obesity, who is here today for follow-up.  Prior exercise tolerance testing showed 1.5 to 2 mm horizontal upsloping ST depressions in the inferior lateral leads.  He was feeling well without chest pain or shortness of breath.  Due to borderline exercise tolerance test he underwent coronary CTA which showed mild nonobstructive CAD and moderate coronary artery calcium score.  He was last seen in clinic 12/12/2022 by Dr. Hanson.  Overall from a cardiac perspective he had been doing well.  There were no medication changes that were made or further testing that was ordered at that time.  He returns clinic today stating that he has been doing well from a cardiac perspective.  He denies any chest pain or shortness of breath.  He has noted on occasion primarily in the evening he does have an occasional palpitations where he feels as though his heart may be a little bit irregular is not with exertion and primarily with rest.  He states that when he notices it the most is when he is lying back in his lounge chair in the evening and is just resting.  It is not impacting his ADLs or been bothersome he has just noted.  States that he has been compliant with his current medications has not required the use of any Nitrostat .  Denies any hospitalizations or visits to the emergency department.  ROS: 10 point review of systems has been reviewed and considered negative the exception was been listed in the HPI  Studies Reviewed EKG Interpretation Date/Time:  Friday January 16 2024 09:03:05 EDT Ventricular Rate:  74 PR Interval:  152 QRS  Duration:  88 QT Interval:  364 QTC Calculation: 404 R Axis:   -3  Text Interpretation: Sinus rhythm with Premature supraventricular complexes When compared with ECG of 12-Jun-2022 09:58, Confirmed by Gerard Frederick ([id number]) on 01/16/2024 9:22:15 AM    Coronary Calcium Scoring 09/30/2022 IMPRESSION: 1. Coronary calcium score of 56.1. This was 37th percentile for age and sex matched control.   2. Normal coronary origin with right dominance.   3. Minimal proximal LAD and Ramus stenosis (<25%).   4. CAD-RADS 1. Minimal non-obstructive CAD (0-24%). Consider non-atherosclerotic causes of chest pain. Consider preventive therapy and risk factor modification.   ETT 09/04/2022 A Bruce protocol stress test was performed. Exercise capacity was mildly impaired. Patient exercised for 7 min and 0 sec. Maximum HR of 160 bpm. MPHR 105.0 %. Peak METS 8.5 . The patient experienced no angina during the test. The patient achieved the target heart rate. Normal blood pressure and normal heart rate response noted during stress. Heart rate recovery was normal.   1.5 to 2 mm of horizontal to upsloping ST depression in the inferolateral leads (II, III, aVF, V5 and V6) was noted. There were no arrhythmias during stress. There were no arrhythmias during recovery. The ECG was borderline for ischemia.   Consider coronary CTA or nuclear stress testing.   Risk Assessment/Calculations           Physical Exam VS:  BP 114/72   Pulse 74   Ht 5' 11 (1.803 m)   Wt 197  lb 9.6 oz (89.6 kg)   SpO2 96%   BMI 27.56 kg/m        Wt Readings from Last 3 Encounters:  01/16/24 197 lb 9.6 oz (89.6 kg)  12/12/22 228 lb (103.4 kg)  09/13/22 222 lb (100.7 kg)    GEN: Well nourished, well developed in no acute distress NECK: No JVD; No carotid bruits CARDIAC: RRR, no murmurs, rubs, gallops RESPIRATORY:  Clear to auscultation without rales, wheezing or rhonchi  ABDOMEN: Soft, non-tender, non-distended EXTREMITIES:  No  edema; No deformity   ASSESSMENT AND PLAN Coronary artery disease with recent coronary CTA revealing mild plaquing without significant stenosis.  Coronary calcium score is 56 which is the 37th percentile for age and sex matched control.  EKG today reveals sinus rhythm with a rate of 74 with occasional PAC with no acute ischemic changes needed.  No further ischemic workup is needed at this time.  Mixed hyperlipidemia with last LDL of 83.  He has upcoming blood work with his PCP.  Goal of LDL less than 100.  He has remained at goal.  He continues to work on diet and exercise.  With continued reduction in LDL and upcoming labs as last lipid panel in the system is from 2023.  Will defer adding statin at this time.  Will revisit addition of statin therapy after labs resulted from PCP.  Continue ASCVD risk of 17.2% recommends moderate intensity statin.  Palpitations that are infrequent.  PACs noted on EKG which is likely the the cause.  And with infrequent balance we will defer placing on a ZIO monitor at this time.  If palpitations become worsening more intense and more frequent can consider ZIO XT monitoring for 2 weeks at that time to rule out arrhythmia.       Dispo: Patient to return to clinic to see MD/APP in 11 to 12 months or sooner if needed for further evaluation.  Signed, Kyrstyn Greear, NP

## 2024-01-16 ENCOUNTER — Ambulatory Visit: Attending: Cardiology | Admitting: Cardiology

## 2024-01-16 ENCOUNTER — Encounter: Payer: Self-pay | Admitting: Cardiology

## 2024-01-16 VITALS — BP 114/72 | HR 74 | Ht 71.0 in | Wt 197.6 lb

## 2024-01-16 DIAGNOSIS — I2510 Atherosclerotic heart disease of native coronary artery without angina pectoris: Secondary | ICD-10-CM | POA: Insufficient documentation

## 2024-01-16 DIAGNOSIS — E785 Hyperlipidemia, unspecified: Secondary | ICD-10-CM | POA: Diagnosis present

## 2024-01-16 DIAGNOSIS — R002 Palpitations: Secondary | ICD-10-CM | POA: Diagnosis present

## 2024-01-16 NOTE — Patient Instructions (Signed)
 Medication Instructions:  Your physician recommends that you continue on your current medications as directed. Please refer to the Current Medication list given to you today.   *If you need a refill on your cardiac medications before your next appointment, please call your pharmacy*  Lab Work: No labs ordered today  If you have labs (blood work) drawn today and your tests are completely normal, you will receive your results only by: MyChart Message (if you have MyChart) OR A paper copy in the mail If you have any lab test that is abnormal or we need to change your treatment, we will call you to review the results.  Testing/Procedures: No test ordered today   Follow-Up: At [facility], you and your health needs are our priority.  As part of our continuing mission to provide you with exceptional heart care, our providers are all part of one team.  This team includes your primary Cardiologist (physician) and Advanced Practice Providers or APPs (Physician Assistants and Nurse Practitioners) who all work together to provide you with the care you need, when you need it.  Your next appointment:   11 - 12 month(s)  Provider:   Lonni Hanson, MD or Tylene Lunch, NP    We recommend signing up for the patient portal called MyChart.  Sign up information is provided on this After Visit Summary.  MyChart is used to connect with patients for Virtual Visits (Telemedicine).  Patients are able to view lab/test results, encounter notes, upcoming appointments, etc.  Non-urgent messages can be sent to your provider as well.   To learn more about what you can do with MyChart, go to [email or web address].

## 2024-02-08 NOTE — Progress Notes (Signed)
 02/10/24- 72 yoM never smoker for sleep evaluation courtesy of Dr Diedra with concern of OSA Medical problem list includes CAD, HTN, Migraine Epworth score-4 Body weight today-194 lbs -----Patient currently on CPAP.  Loud snoring. Discussed the use of AI scribe software for clinical note transcription with the patient, who gave verbal consent to proceed.  History of Present Illness   Gabriel Crawford is a 73 year old male with obstructive sleep apnea who presents with issues related to CPAP machine noise and pressure settings.  He was diagnosed with obstructive sleep apnea in 2007 and initially used a CPAP machine set at 9 cm H2O. In 2016, his pressure setting was increased to 13 cm H2O, and he currently uses a setting of 15 cm H2O. He finds this higher pressure necessary for sleep initiation but experiences frequent nocturnal awakenings to alleviate air swallowing.  He uses an older CPAP machine due to excessive noise from newer models, including the ResMed Airsense 10, which has caused his wife to sleep in a separate room. Attempts to replace the machine have not resolved the noise issue.  He has lost 30-40 pounds since 2007 but continues to require CPAP therapy. He denies the use of sleep medications, caffeine, cigarettes, or alcohol. He has no history of lung disease.     ENT- tonsils in childhood. Original sleep study in 2007- hospital now closed.  Prior to Admission medications   Medication Sig Start Date End Date Taking? Authorizing Provider  acetaminophen  (TYLENOL ) 500 MG tablet Take 500-1,500 mg by mouth every 6 (six) hours as needed for moderate pain.   Yes [provider]  acetic acid 2 % otic solution Place 4 drops into both ears as needed (moist ears).   Yes [provider]  atorvastatin (LIPITOR) 20 MG tablet Take 20 mg by mouth daily. 11/08/23  Yes [provider]  Cholecalciferol (VITAMIN D) 125 MCG (5000 UT) CAPS Take 5,000 Units by mouth daily.   Yes  [provider]  Coenzyme Q10 (CO Q-10) 400 MG CAPS Take 400 Units by mouth daily.   Yes [provider]  cyanocobalamin 1000 MCG tablet Take 1,000 mcg by mouth daily.   Yes [provider]  ibuprofen (ADVIL) 200 MG tablet Take 800 mg by mouth every 6 (six) hours as needed for moderate pain.   Yes [provider]  lidocaine  (LIDODERM ) 5 % Place 1 patch onto the skin daily as needed (shoulder pain). Remove & Discard patch within 12 hours or as directed by MD   Yes [provider]  Lutein 40 MG CAPS Take 40 mg by mouth daily.   Yes [provider]  nitroGLYCERIN  (NITROSTAT ) 0.4 MG SL tablet Place 1 tablet (0.4 mg total) under the tongue every 5 (five) minutes as needed for chest pain. 06/12/22  Yes Mumma, Clotilda, MD  NON FORMULARY Pt uses a cpap nighlty   Yes [provider]  prochlorperazine (COMPAZINE) 5 MG tablet Take 5 mg by mouth every 6 (six) hours as needed for nausea or vomiting. 12/11/23  Yes [provider]  saw palmetto 500 MG capsule Take 500 mg by mouth daily.   Yes [provider]  sodium chloride  (OCEAN) 0.65 % nasal spray Place 2 sprays into the nose as needed for congestion. 07/31/23  Yes [provider]  tadalafil (CIALIS) 10 MG tablet Take 10 mg by mouth. 08/10/23  Yes [provider]  triamcinolone cream (KENALOG) 0.1 % Apply 1 application. topically daily as needed (  moist ears). 05/31/21  Yes [provider]  Ubrogepant 100 MG TABS Take 100 mg by mouth. 12/16/22  Yes [provider]   Past Medical History:  Diagnosis Date   Anxiety    Arrhythmia    Headache    migraines - OTC med prn   Hearing loss    wears hearing aids   History of kidney stones    HLD (hyperlipidemia)    Low back pain    Lung nodules    Pre-diabetes    diet controlled   Right bundle branch block (RBBB) 1978   Sleep apnea    uses cpap   Past Surgical History:  Procedure Laterality Date    APPENDECTOMY  1967   ARTHROSCOPIC REPAIR ACL Right 1995   COLONOSCOPY  1986   [place], [place] - normal per patient   COLONOSCOPY  10/31/2016   Internal Hemorrhoids, Diverticulosis: CBF 10/2026   EXCISION MASS UPPER EXTREMETIES Right 10/30/2021   Procedure: EXCISION OF PAINFUL SCAR TISSUE/RETAINED SUTURE MATERIAL ANTERIOR ASPECT OF RIGHT SHOULDER;  Surgeon: Edie Norleen PARAS, MD;  Location: [facility];  Service: Orthopedics;  Laterality: Right;   FINGER SURGERY  1971   left index finger   RADIOLOGY WITH ANESTHESIA Right 04/19/2021   Procedure: MRI WITH ANESTHESIA - RIGHT SHOULDER WITHOUT CONTRAST;  Surgeon: Radiologist, Medication, MD;  Location: [facility] OR;  Service: Radiology;  Laterality: Right;   renal stone removal     x 3   SHOULDER ARTHROSCOPY WITH SUBACROMIAL DECOMPRESSION, ROTATOR CUFF REPAIR AND BICEP TENDON REPAIR Right 06/12/2021   Procedure: Shoulder arthroscopy with debridement, decompression, distal clavicle excision, biceps tenodesis and rotator cuff repair;  Surgeon: Edie Norleen PARAS, MD;  Location: [facility];  Service: Orthopedics;  Laterality: Right;   TONSILLECTOMY  1957   Family History  Problem Relation Age of Onset   Dementia Mother    Heart disease Neg Hx    Social History   Socioeconomic History   Marital status: Married    Spouse name: Not on file   Number of children: Not on file   Years of education: Not on file   Highest education level: Not on file  Occupational History   Not on file  Tobacco Use   Smoking status: Never   Smokeless tobacco: Never  Vaping Use   Vaping status: Never Used  Substance and Sexual Activity   Alcohol use: Not Currently    Comment: socially-about 2x/year   Drug use: Never   Sexual activity: Not on file  Other Topics Concern   Not on file  Social History Narrative   Not on file   Social Drivers of Health   Financial Resource Strain: Low Risk  (02/13/2024)   Received from [facility]   Overall Financial Resource  Strain (CARDIA)    Difficulty of Paying Living Expenses: Not hard at all  Food Insecurity: No Food Insecurity (02/13/2024)   Received from [facility]   Hunger Vital Sign    Within the past 12 months, you worried that your food would run out before you got the money to buy more.: Never true    Within the past 12 months, the food you bought just didn't last and you didn't have money to get more.: Never true  Transportation Needs: No Transportation Needs (02/13/2024)   Received from [facility] - Transportation    In the past 12 months, has lack of transportation kept you from medical appointments or  from getting medications?: No    Lack of Transportation (Non-Medical): No  Physical Activity: Not on file  Stress: Not on file  Social Connections: Not on file  Intimate Partner Violence: Not on file   Assessment and Plan:  Obstructive Sleep Apnea Chronic obstructive sleep apnea managed with CPAP. Current machine causes noise and discomfort. Pressure setting at 15 cm H2O causes aerophagia. Weight loss may affect apnea severity. No recent sleep study since 2007. - Order home sleep test to re-evaluate and confirm diagnosis. - Request [employer] to service CPAP machine for noise and adjust to autopap 10-20 cm H2O. - Advise use of old CPAP machine until servicing. - Instruct to avoid CPAP during home sleep test night. - Follow up with [employer] if no contact within a week.     Headaches Unclear relation to OSA  ROS-see HPI   + = positive Constitutional:    weight loss, night sweats, fevers, chills, fatigue, lassitude. HEENT:    +headaches, difficulty swallowing, tooth/dental problems, sore throat,       sneezing, itching, ear ache, nasal congestion, post nasal drip, snoring CV:    chest pain, orthopnea, PND, swelling in lower extremities, anasarca,                                   dizziness, palpitations Resp:   shortness of breath with exertion or at  rest.                productive cough,   non-productive cough, coughing up of blood.              change in color of mucus.  wheezing.   Skin:    rash or lesions. GI:  No-   heartburn, indigestion, abdominal pain, nausea, vomiting, diarrhea,                 change in bowel habits, loss of appetite GU: dysuria, change in color of urine, no urgency or frequency.   flank pain. MS:   joint pain, stiffness, decreased range of motion, back pain. Neuro-     nothing unusual Psych:  change in mood or affect.  depression or anxiety.   memory loss.  OBJ- Physical Exam General- Alert, Oriented, Affect-appropriate, Distress- none acute, not obese Skin- rash-none, lesions- none, excoriation- none Lymphadenopathy- none Head- atraumatic            Eyes- Gross vision intact, PERRLA, conjunctivae and secretions clear            Ears- Hearing, canals-normal            Nose- Clear, no-Septal dev, mucus, polyps, erosion, perforation             Throat- Mallampati II-III , mucosa clear , drainage- none, tonsils- atrophic, +teeth Neck- flexible , trachea midline, no stridor , thyroid nl, carotid no bruit Chest - symmetrical excursion , unlabored           Heart/CV- RRR , no murmur , no gallop  , no rub, nl s1 s2                           - JVD- none , edema- none, stasis changes- none, varices- none           Lung- clear to P&A, wheeze- none, cough- none , dullness-none, rub- none  Chest wall-  Abd-  Br/ Gen/ Rectal- Not done, not indicated Extrem- cyanosis- none, clubbing, none, atrophy- none, strength- nl Neuro- grossly intact to observation

## 2024-02-10 ENCOUNTER — Encounter: Payer: Self-pay | Admitting: Internal Medicine

## 2024-02-10 ENCOUNTER — Ambulatory Visit: Admitting: Internal Medicine

## 2024-02-10 VITALS — BP 112/76 | HR 76 | Temp 98.2°F | Ht 70.0 in | Wt 194.8 lb

## 2024-02-10 DIAGNOSIS — G4733 Obstructive sleep apnea (adult) (pediatric): Secondary | ICD-10-CM

## 2024-02-10 NOTE — Patient Instructions (Signed)
 Order- DME [employer]- please service current Airsense 10 CPAP- too noisy- change to auto 10-20, continue mask of choice, heated humidifier, supplies, and provide download  capability-AirView/ card  Order- schedule home sleep test Off CPAP   dx OSA  Please call about 2 weeks after your sleep test for result and recommendations

## 2024-02-16 ENCOUNTER — Encounter (HOSPITAL_BASED_OUTPATIENT_CLINIC_OR_DEPARTMENT_OTHER): Admitting: Internal Medicine

## 2024-02-22 ENCOUNTER — Encounter: Payer: Self-pay | Admitting: Internal Medicine

## 2024-03-02 ENCOUNTER — Ambulatory Visit

## 2024-03-02 DIAGNOSIS — G4733 Obstructive sleep apnea (adult) (pediatric): Secondary | ICD-10-CM

## 2024-03-08 DIAGNOSIS — G4733 Obstructive sleep apnea (adult) (pediatric): Secondary | ICD-10-CM | POA: Diagnosis not present

## 2024-03-09 ENCOUNTER — Ambulatory Visit: Admitting: Internal Medicine

## 2024-04-28 ENCOUNTER — Telehealth: Payer: Self-pay

## 2024-04-28 NOTE — Progress Notes (Signed)
 02/10/24- 72 yoM never smoker for sleep evaluation courtesy of Dr Diedra with concern of OSA Medical problem list includes CAD, HTN, Migraine Epworth score-4 Body weight today-194 lbs -----Patient currently on CPAP.  Loud snoring. Discussed the use of AI scribe software for clinical note transcription with the patient, who gave verbal consent to proceed.  History of Present Illness   Gabriel Crawford is a 73 year old male with obstructive sleep apnea who presents with issues related to CPAP machine noise and pressure settings.  He was diagnosed with obstructive sleep apnea in 2007 and initially used a CPAP machine set at 9 cm H2O. In 2016, his pressure setting was increased to 13 cm H2O, and he currently uses a setting of 15 cm H2O. He finds this higher pressure necessary for sleep initiation but experiences frequent nocturnal awakenings to alleviate air swallowing.  He uses an older CPAP machine due to excessive noise from newer models, including the ResMed Airsense 10, which has caused his wife to sleep in a separate room. Attempts to replace the machine have not resolved the noise issue.  He has lost 30-40 pounds since 2007 but continues to require CPAP therapy. He denies the use of sleep medications, caffeine, cigarettes, or alcohol. He has no history of lung disease.     ENT- tonsils in childhood. Original sleep study in 2007- hospital now closed.   Assessment and Plan:  Obstructive Sleep Apnea Chronic obstructive sleep apnea managed with CPAP. Current machine causes noise and discomfort. Pressure setting at 15 cm H2O causes aerophagia. Weight loss may affect apnea severity. No recent sleep study since 2007. - Order home sleep test to re-evaluate and confirm diagnosis. - Request [employer] to service CPAP machine for noise and adjust to autopap 10-20 cm H2O. - Advise use of old CPAP machine until servicing. - Instruct to avoid CPAP during home sleep test night. - Follow up with [employer] if no  contact within a week.     Headaches Unclear relation to OSA   04/29/24- 72 yoM followed for OSA, complicated by CAD, HTN, Migraine Hx remote sleep study 2007 CPAP 10-20/ [employer]   machine serviced for noise after 02/10/24 visit>> today changing to auto 4-10 HST 02/10/24- AHI 5.5/hr, desat to 87%, body weight 194 lbs For treatment decision Body weight today- 91 lbs   He states he has now lost 50 lbs from peak. CPAP stops his snore and he is used to it. We are going to reduce auto range to 4-10, but continue CPAP for now, since he has it, and see how he feels.   ROS-see HPI   + = positive Constitutional:    weight loss, night sweats, fevers, chills, fatigue, lassitude. HEENT:    +headaches, difficulty swallowing, tooth/dental problems, sore throat,       sneezing, itching, ear ache, nasal congestion, post nasal drip, snoring CV:    chest pain, orthopnea, PND, swelling in lower extremities, anasarca,                                   dizziness, palpitations Resp:   shortness of breath with exertion or at rest.                productive cough,   non-productive cough, coughing up of blood.              change in color of mucus.  wheezing.   Skin:  rash or lesions. GI:  No-   heartburn, indigestion, abdominal pain, nausea, vomiting, diarrhea,                 change in bowel habits, loss of appetite GU: dysuria, change in color of urine, no urgency or frequency.   flank pain. MS:   joint pain, stiffness, decreased range of motion, back pain. Neuro-     nothing unusual Psych:  change in mood or affect.  depression or anxiety.   memory loss.  OBJ- Physical Exam General- Alert, Oriented, Affect-appropriate, Distress- none acute, +very lean now Skin- rash-none, lesions- none, excoriation- none Lymphadenopathy- none Head- atraumatic            Eyes- Gross vision intact, PERRLA, conjunctivae and secretions clear            Ears- Hearing, canals-normal            Nose- Clear, no-Septal dev,  mucus, polyps, erosion, perforation             Throat- Mallampati II-III , mucosa clear , drainage- none, tonsils- atrophic, +teeth Neck- flexible , trachea midline, no stridor , thyroid nl, carotid no bruit Chest - symmetrical excursion , unlabored           Heart/CV- RRR , no murmur , no gallop  , no rub, nl s1 s2                           - JVD- none , edema- none, stasis changes- none, varices- none           Lung- clear to P&A, wheeze- none, cough- none , dullness-none, rub- none           Chest wall-  Abd-  Br/ Gen/ Rectal- Not done, not indicated Extrem- cyanosis- none, clubbing, none, atrophy- none, strength- nl Neuro- grossly intact to observation

## 2024-04-28 NOTE — Telephone Encounter (Signed)
 Pt I scheduled to see Dr Neysa tomorrow for a f/u. I could not find pt in airview. I called and spoke to pt. Pt states he is not treating his OSA with cpap as he was having issues with the pressure. Pt states he has not treated this since Aug 2025.

## 2024-04-29 ENCOUNTER — Encounter: Payer: Self-pay | Admitting: Internal Medicine

## 2024-04-29 ENCOUNTER — Ambulatory Visit: Admitting: Internal Medicine

## 2024-04-29 VITALS — BP 124/76 | HR 78 | Temp 98.0°F | Ht 71.0 in | Wt 191.4 lb

## 2024-04-29 DIAGNOSIS — I2510 Atherosclerotic heart disease of native coronary artery without angina pectoris: Secondary | ICD-10-CM

## 2024-04-29 DIAGNOSIS — G4733 Obstructive sleep apnea (adult) (pediatric): Secondary | ICD-10-CM | POA: Diagnosis not present

## 2024-04-29 NOTE — Patient Instructions (Addendum)
 Order- DME [employer]- please reduce autopap range to 4-10. Continue mask of choice, humidifier, supplies, Air View/ card  If it is still uncomfortable, please let me know.

## 2024-05-02 ENCOUNTER — Encounter: Payer: Self-pay | Admitting: Internal Medicine

## 2024-05-02 DIAGNOSIS — G4733 Obstructive sleep apnea (adult) (pediatric): Secondary | ICD-10-CM | POA: Insufficient documentation

## 2024-05-02 NOTE — Assessment & Plan Note (Signed)
Cardiology to follow 

## 2024-05-02 NOTE — Assessment & Plan Note (Signed)
 Mild OSA at lean body weight Plan- reduce autopap to 4-10 and see if it still seems helpful.

## 2024-11-05 ENCOUNTER — Encounter
